# Patient Record
Sex: Male | Born: 1935
Health system: Southern US, Community
[De-identification: ages and names within clinical notes are randomized; demographics above are authoritative.]

## PROBLEM LIST (undated history)

## (undated) DIAGNOSIS — E785 Hyperlipidemia, unspecified: Secondary | ICD-10-CM

## (undated) DIAGNOSIS — K219 Gastro-esophageal reflux disease without esophagitis: Secondary | ICD-10-CM

## (undated) DIAGNOSIS — C61 Malignant neoplasm of prostate: Secondary | ICD-10-CM

## (undated) DIAGNOSIS — J45909 Unspecified asthma, uncomplicated: Secondary | ICD-10-CM

## (undated) DIAGNOSIS — R12 Heartburn: Secondary | ICD-10-CM

## (undated) DIAGNOSIS — I10 Essential (primary) hypertension: Secondary | ICD-10-CM

## (undated) DIAGNOSIS — G4730 Sleep apnea, unspecified: Secondary | ICD-10-CM

## (undated) HISTORY — DX: Hyperlipidemia, unspecified: E78.5

## (undated) HISTORY — DX: Gastro-esophageal reflux disease without esophagitis: K21.9

## (undated) HISTORY — DX: Heartburn: R12

## (undated) HISTORY — DX: Essential (primary) hypertension: I10

## (undated) HISTORY — PX: CARPAL TUNNEL RELEASE: SHX101

## (undated) HISTORY — DX: Malignant neoplasm of prostate: C61

## (undated) HISTORY — PX: PROSTATE BIOPSY: SHX241

---

## 1965-01-27 HISTORY — PX: APPENDECTOMY: SHX54

## 2000-08-12 ENCOUNTER — Ambulatory Visit (HOSPITAL_COMMUNITY): Admission: RE | Admit: 2000-08-12 | Discharge: 2000-08-12 | Payer: Self-pay | Admitting: Gastroenterology

## 2003-11-27 ENCOUNTER — Encounter: Admission: RE | Admit: 2003-11-27 | Discharge: 2003-11-27 | Payer: Self-pay | Admitting: Family Medicine

## 2013-02-08 ENCOUNTER — Other Ambulatory Visit (HOSPITAL_COMMUNITY): Payer: Self-pay | Admitting: Urology

## 2013-02-08 DIAGNOSIS — C61 Malignant neoplasm of prostate: Secondary | ICD-10-CM

## 2013-03-07 ENCOUNTER — Encounter (HOSPITAL_COMMUNITY)
Admission: RE | Admit: 2013-03-07 | Discharge: 2013-03-07 | Disposition: A | Discharge status: Home / Self Care | Payer: Medicare PPO | Source: Ambulatory Visit | Attending: Urology | Admitting: Urology

## 2013-03-07 DIAGNOSIS — C61 Malignant neoplasm of prostate: Secondary | ICD-10-CM | POA: Insufficient documentation

## 2013-03-07 DIAGNOSIS — M899 Disorder of bone, unspecified: Secondary | ICD-10-CM | POA: Insufficient documentation

## 2013-03-07 DIAGNOSIS — M949 Disorder of cartilage, unspecified: Secondary | ICD-10-CM

## 2013-03-07 MED ORDER — TECHNETIUM TC 99M MEDRONATE IV KIT
27.0000 | PACK | Freq: Once | INTRAVENOUS | Status: AC | PRN
  Administered 2013-03-07: 27 via INTRAVENOUS

## 2013-03-11 ENCOUNTER — Encounter: Payer: Self-pay | Admitting: Radiation Oncology

## 2013-03-11 DIAGNOSIS — C61 Malignant neoplasm of prostate: Secondary | ICD-10-CM | POA: Insufficient documentation

## 2013-03-11 NOTE — Progress Notes (Signed)
GU Location of Tumor / Histology: adenocarcinoma of the prostate  If Prostate Cancer, Gleason Score is (4 + 3) and PSA is (12.7) November 2014.  Patient presented 2010 with an elevated PSA of 5.1.  Biopsies of prostate (if applicable) revealed:    Past/Anticipated interventions by urology, if any: administered six month injection of Trelstar on 03/09/2013  Past/Anticipated interventions by medical oncology, if any: None  Weight changes, if any:   Bowel/Bladder complaints, if any:    Nausea/Vomiting, if any:   Pain issues, if any:    SAFETY ISSUES:  Prior radiation? NO  Pacemaker/ICD? NO  Possible current pregnancy? N/A  Is the patient on methotrexate? N/A  Current Complaints / other details:  80 year old male. Married. Self employed-upholstery. One son and one daughter.

## 2013-03-14 ENCOUNTER — Ambulatory Visit
Admission: RE | Admit: 2013-03-14 | Discharge: 2013-03-14 | Disposition: A | Discharge status: Home / Self Care | Payer: Medicare PPO | Source: Ambulatory Visit | Attending: Radiation Oncology | Admitting: Radiation Oncology

## 2013-03-14 ENCOUNTER — Encounter: Payer: Self-pay | Admitting: Radiation Oncology

## 2013-03-14 VITALS — BP 162/72 | HR 59 | Temp 98.0°F | Resp 16 | Ht 70.0 in | Wt 227.0 lb

## 2013-03-14 DIAGNOSIS — C61 Malignant neoplasm of prostate: Secondary | ICD-10-CM | POA: Insufficient documentation

## 2013-03-14 DIAGNOSIS — E785 Hyperlipidemia, unspecified: Secondary | ICD-10-CM | POA: Insufficient documentation

## 2013-03-14 DIAGNOSIS — I10 Essential (primary) hypertension: Secondary | ICD-10-CM | POA: Insufficient documentation

## 2013-03-14 DIAGNOSIS — Z7982 Long term (current) use of aspirin: Secondary | ICD-10-CM | POA: Insufficient documentation

## 2013-03-14 DIAGNOSIS — Z79899 Other long term (current) drug therapy: Secondary | ICD-10-CM | POA: Insufficient documentation

## 2013-03-14 NOTE — Addendum Note (Signed)
Encounter addended by: Heywood Footman, RN on: 03/14/2013  4:50 PM<BR>     Documentation filed: Charges VN

## 2013-03-14 NOTE — Progress Notes (Signed)
Denies dysuria or hematuria. Reports occasionally he has to strain to void. Weight stable. Denies diarrhea, painful bowel movements or blood in the stool. Denies pain at this time. Reports occasional joint pain related to effects of arthritis. Denies nausea, vomiting, headache or dizziness. Reports nocturia x 3. Reports an occasional weak stream. Reports only rare occasions that he doesn't feel like he empties his bladder completely. Reports he remain active caring for his wife in their home. Reports hospice comes once a week to bath his wife and his daughter cook their meals. Patient states, "I think I feel and look pretty good for my age."

## 2013-03-14 NOTE — Progress Notes (Signed)
Radiation Oncology         [phone] ________________________________  Initial outpatient Consultation  Name: Patrick Carey MRN: [id number]  Date: 03/14/2013  DOB: [birth year]  FW:YOVZCH,YIFOYD, PA-C  Bernestine Amass, MD   REFERRING PHYSICIAN: Bernestine Amass, MD  DIAGNOSIS: 80 y.o. gentleman with stage T1c adenocarcinoma of the prostate with a Gleason's score of 4+3 and a PSA of 12.7  HISTORY OF PRESENT ILLNESS::Patrick Carey is a 80 y.o. gentleman with a history of elevated fluctuating PSA's.  He was noted to have an elevated PSA of 10 with his primary care doctor and a repeat with Dr. Risa Grill remained elevated at 12.72 on December 09, 2012.  Dr. Risa Grill performed digital rectal examination at that time revealing no nodules.  The patient proceeded to transrectal ultrasound with 12 biopsies of the prostate on 02/10/13.  The prostate volume measured 63 cc.  Out of 10 core biopsies, 7 were positive.  The maximum Gleason score was 4+3, and this was seen in 3 of the right sided specimens and one of the left as below.  The patient reviewed the biopsy results with his urologist and he has kindly been referred today for discussion of potential radiation treatment options.  PREVIOUS RADIATION THERAPY: No  PAST MEDICAL HISTORY:  has a past medical history of Heartburn; Hyperlipidemia; Hypertension; and Prostate cancer.    PAST SURGICAL HISTORY: Past Surgical History  Procedure Laterality Date  . Prostate biopsy    . Appendectomy  1967    FAMILY HISTORY: family history includes Cancer in his brother and father.  SOCIAL HISTORY:  reports that he has never smoked. He has never used smokeless tobacco. He reports that he does not drink alcohol or use illicit drugs.  ALLERGIES: Lipitor and Prednisone  MEDICATIONS:  Current Outpatient Prescriptions  Medication Sig Dispense Refill  . aspirin 81 MG tablet Take 81 mg by mouth daily.      Marland Kitchen doxazosin (CARDURA) 4 MG tablet Take 4 mg by mouth  daily.      Marland Kitchen lisinopril-hydrochlorothiazide (PRINZIDE,ZESTORETIC) 20-12.5 MG per tablet Take 1 tablet by mouth daily.      . metoprolol (LOPRESSOR) 50 MG tablet       . omeprazole (PRILOSEC) 20 MG capsule Take 20 mg by mouth daily.      . pravastatin (PRAVACHOL) 40 MG tablet Take 40 mg by mouth daily.      . bicalutamide (CASODEX) 50 MG tablet       . metoprolol succinate (TOPROL-XL) 50 MG 24 hr tablet Take 50 mg by mouth daily. Take with or immediately following a meal.      . Omega-3 Fatty Acids (FISH OIL MAXIMUM STRENGTH PO) Take by mouth.       No current facility-administered medications for this encounter.    REVIEW OF SYSTEMS:  A 15 point review of systems is documented in the electronic medical record. This was obtained by the nursing staff. However, I reviewed this with the patient to discuss relevant findings and make appropriate changes.  A comprehensive review of systems was negative..  The patient completed an IPSS and IIEF questionnaire.  His IPSS score was 13 indicating moderate urinary outflow obstructive symptoms.  He indicated that his erectile function is unable to complete sexual activity.   PHYSICAL EXAM: This patient is in no acute distress.  He is alert and oriented.   height is 5\' 10"  (1.778 m) and weight is 227 lb (102.967 kg). His oral temperature is 98 F (  36.7 C). His blood pressure is 162/72 and his pulse is 59. His respiration is 16 and oxygen saturation is 100%.  He exhibits no respiratory distress or labored breathing.  He appears neurologically intact.  His mood is pleasant.  His affect is appropriate.  Please note the digital rectal exam findings described above.  KPS = 100  100 - Normal; no complaints; no evidence of disease. 90   - Able to carry on normal activity; minor signs or symptoms of disease. 80   - Normal activity with effort; some signs or symptoms of disease. 106   - Cares for self; unable to carry on normal activity or to do active work. 60   -  Requires occasional assistance, but is able to care for most of his personal needs. 50   - Requires considerable assistance and frequent medical care. 59   - Disabled; requires special care and assistance. 92   - Severely disabled; hospital admission is indicated although death not imminent. 63   - Very sick; hospital admission necessary; active supportive treatment necessary. 10   - Moribund; fatal processes progressing rapidly. 0     - Dead  Karnofsky DA, Abelmann WH, Craver LS and Burchenal JH [phone]) The use of the nitrogen mustards in the palliative treatment of carcinoma: with particular reference to bronchogenic carcinoma Cancer 1 634-56   LABORATORY DATA:  No results found for this basename: WBC,  HGB,  HCT,  MCV,  PLT   No results found for this basename: NA,  K,  CL,  CO2   No results found for this basename: ALT,  AST,  GGT,  ALKPHOS,  BILITOT     RADIOGRAPHY: Nm Bone Scan Whole Body  03/07/2013   CLINICAL DATA:  Prostate cancer.  EXAM: NUCLEAR MEDICINE WHOLE BODY BONE SCAN  TECHNIQUE: Whole body anterior and posterior images were obtained approximately 3 hours after intravenous injection of radiopharmaceutical.  COMPARISON:  None.  RADIOPHARMACEUTICALS:  27.0 Technetium-99 MDP  FINDINGS: Areas of moderate degenerative type uptake noted in both shoulders, both knees and both feet. There is also moderate degenerative changes at the sternoclavicular joints and in the spine. A single rib lesion is noted involving the right eighth posterior rib. This is likely posttraumatic. No findings suspicious for metastatic prostate cancer.  IMPRESSION: Areas of degenerative type uptake and a single rib lesion (likely posttraumatic).  No definite findings for prostate metastasis.   Electronically Signed   By: Kalman Jewels M.D.   On: 03/07/2013 15:27      IMPRESSION: This gentleman is a 80 y.o. gentleman with stage T1c adenocarcinoma of the prostate with a Gleason's score of 4+3 and a PSA of 12.7.   His T-Stage, Gleason's Score, and PSA put him into the intermediate risk group.  Accordingly he is eligible for a variety of potential treatment options including androgen deprivation for 6 months with definitive IMRT.  PLAN:Today I reviewed the findings and workup thus far.  We discussed the natural history of prostate cancer.  We reviewed the the implications of T-stage, Gleason's Score, and PSA on decision-making and outcomes in prostate cancer.  We discussed radiation treatment in the management of prostate cancer with regard to the logistics and delivery of external beam radiation treatment as well as the logistics and delivery of prostate brachytherapy.  We compared and contrasted each of these approaches and also compared these against prostatectomy.  The patient expressed interest in external beam radiotherapy.  I filled out a patient counseling form  for him with relevant treatment diagrams and we retained a copy for our records.   The patient would like to proceed with prostate IMRT.  But, he would like to pursue treatment closer to his home in the [facility].  I will coordinate a consultation with Dr. Orlene Erm there.  I will share my findings with Dr. Risa Grill and move forward with scheduling placement of three gold fiducial markers into the prostate to proceed with IMRT in the beginning of April.     I enjoyed meeting with him today, and will look forward to following the progress in the care of this very nice gentleman.   I spent 60 minutes face to face with the patient and more than 50% of that time was spent in counseling and/or coordination of care.   ------------------------------------------------  Sheral Apley. Tammi Klippel, M.D.

## 2013-03-14 NOTE — Progress Notes (Signed)
See progress note under physician encounter. 

## 2013-03-15 ENCOUNTER — Telehealth: Payer: Self-pay | Admitting: *Deleted

## 2013-03-15 NOTE — Telephone Encounter (Signed)
xxxx 

## 2013-03-15 NOTE — Telephone Encounter (Signed)
Called patient to inform of [facility] appt. With Dr. Risa Grill on 04-12-13- arrival time - 3:30 p.m., spoke with patient's wife - Clara and she is aware of this appt.

## 2013-03-30 NOTE — Addendum Note (Signed)
Encounter addended by: Heywood Footman, RN on: 03/30/2013  9:36 AM<BR>     Documentation filed: Charges VN

## 2014-02-16 DIAGNOSIS — M79641 Pain in right hand: Secondary | ICD-10-CM | POA: Diagnosis not present

## 2014-02-16 DIAGNOSIS — R202 Paresthesia of skin: Secondary | ICD-10-CM | POA: Diagnosis not present

## 2014-04-17 DIAGNOSIS — E782 Mixed hyperlipidemia: Secondary | ICD-10-CM | POA: Diagnosis not present

## 2014-04-17 DIAGNOSIS — Z9181 History of falling: Secondary | ICD-10-CM | POA: Diagnosis not present

## 2014-04-17 DIAGNOSIS — G4733 Obstructive sleep apnea (adult) (pediatric): Secondary | ICD-10-CM | POA: Diagnosis not present

## 2014-04-17 DIAGNOSIS — R0602 Shortness of breath: Secondary | ICD-10-CM | POA: Diagnosis not present

## 2014-04-17 DIAGNOSIS — Z79899 Other long term (current) drug therapy: Secondary | ICD-10-CM | POA: Diagnosis not present

## 2014-04-17 DIAGNOSIS — Z0000 Encounter for general adult medical examination without abnormal findings: Secondary | ICD-10-CM | POA: Diagnosis not present

## 2014-04-17 DIAGNOSIS — I10 Essential (primary) hypertension: Secondary | ICD-10-CM | POA: Diagnosis not present

## 2014-04-17 DIAGNOSIS — Z1389 Encounter for screening for other disorder: Secondary | ICD-10-CM | POA: Diagnosis not present

## 2014-04-17 DIAGNOSIS — G4700 Insomnia, unspecified: Secondary | ICD-10-CM | POA: Diagnosis not present

## 2014-04-17 DIAGNOSIS — K219 Gastro-esophageal reflux disease without esophagitis: Secondary | ICD-10-CM | POA: Diagnosis not present

## 2014-04-20 DIAGNOSIS — R05 Cough: Secondary | ICD-10-CM | POA: Diagnosis not present

## 2014-04-20 DIAGNOSIS — R918 Other nonspecific abnormal finding of lung field: Secondary | ICD-10-CM | POA: Diagnosis not present

## 2014-04-20 DIAGNOSIS — R0602 Shortness of breath: Secondary | ICD-10-CM | POA: Diagnosis not present

## 2014-04-20 DIAGNOSIS — J45909 Unspecified asthma, uncomplicated: Secondary | ICD-10-CM | POA: Diagnosis not present

## 2014-05-18 DIAGNOSIS — M25561 Pain in right knee: Secondary | ICD-10-CM | POA: Diagnosis not present

## 2014-05-18 DIAGNOSIS — R0602 Shortness of breath: Secondary | ICD-10-CM | POA: Diagnosis not present

## 2014-05-18 DIAGNOSIS — N644 Mastodynia: Secondary | ICD-10-CM | POA: Diagnosis not present

## 2014-05-24 DIAGNOSIS — M170 Bilateral primary osteoarthritis of knee: Secondary | ICD-10-CM | POA: Diagnosis not present

## 2014-05-24 DIAGNOSIS — M25561 Pain in right knee: Secondary | ICD-10-CM | POA: Diagnosis not present

## 2014-05-24 DIAGNOSIS — M1711 Unilateral primary osteoarthritis, right knee: Secondary | ICD-10-CM | POA: Diagnosis not present

## 2014-05-24 DIAGNOSIS — M25562 Pain in left knee: Secondary | ICD-10-CM | POA: Diagnosis not present

## 2014-05-24 DIAGNOSIS — M1712 Unilateral primary osteoarthritis, left knee: Secondary | ICD-10-CM | POA: Diagnosis not present

## 2014-08-03 DIAGNOSIS — N63 Unspecified lump in breast: Secondary | ICD-10-CM | POA: Diagnosis not present

## 2014-08-03 DIAGNOSIS — N644 Mastodynia: Secondary | ICD-10-CM | POA: Diagnosis not present

## 2014-08-03 DIAGNOSIS — Z6833 Body mass index (BMI) 33.0-33.9, adult: Secondary | ICD-10-CM | POA: Diagnosis not present

## 2014-08-07 DIAGNOSIS — N62 Hypertrophy of breast: Secondary | ICD-10-CM | POA: Diagnosis not present

## 2014-08-07 DIAGNOSIS — Z8546 Personal history of malignant neoplasm of prostate: Secondary | ICD-10-CM | POA: Diagnosis not present

## 2014-08-07 DIAGNOSIS — N644 Mastodynia: Secondary | ICD-10-CM | POA: Diagnosis not present

## 2014-08-10 DIAGNOSIS — C61 Malignant neoplasm of prostate: Secondary | ICD-10-CM | POA: Diagnosis not present

## 2014-09-14 DIAGNOSIS — Z6834 Body mass index (BMI) 34.0-34.9, adult: Secondary | ICD-10-CM | POA: Diagnosis not present

## 2014-09-14 DIAGNOSIS — J019 Acute sinusitis, unspecified: Secondary | ICD-10-CM | POA: Diagnosis not present

## 2014-09-25 DIAGNOSIS — G4733 Obstructive sleep apnea (adult) (pediatric): Secondary | ICD-10-CM | POA: Diagnosis not present

## 2014-09-25 DIAGNOSIS — E782 Mixed hyperlipidemia: Secondary | ICD-10-CM | POA: Diagnosis not present

## 2014-09-25 DIAGNOSIS — Z1389 Encounter for screening for other disorder: Secondary | ICD-10-CM | POA: Diagnosis not present

## 2014-09-25 DIAGNOSIS — I10 Essential (primary) hypertension: Secondary | ICD-10-CM | POA: Diagnosis not present

## 2014-09-25 DIAGNOSIS — K219 Gastro-esophageal reflux disease without esophagitis: Secondary | ICD-10-CM | POA: Diagnosis not present

## 2014-09-25 DIAGNOSIS — Z79899 Other long term (current) drug therapy: Secondary | ICD-10-CM | POA: Diagnosis not present

## 2014-09-25 DIAGNOSIS — Z9181 History of falling: Secondary | ICD-10-CM | POA: Diagnosis not present

## 2014-10-11 DIAGNOSIS — H5210 Myopia, unspecified eye: Secondary | ICD-10-CM | POA: Diagnosis not present

## 2014-10-11 DIAGNOSIS — H524 Presbyopia: Secondary | ICD-10-CM | POA: Diagnosis not present

## 2014-10-11 DIAGNOSIS — Z0100 Encounter for examination of eyes and vision without abnormal findings: Secondary | ICD-10-CM | POA: Diagnosis not present

## 2014-11-10 DIAGNOSIS — M179 Osteoarthritis of knee, unspecified: Secondary | ICD-10-CM | POA: Diagnosis not present

## 2014-11-10 DIAGNOSIS — I10 Essential (primary) hypertension: Secondary | ICD-10-CM | POA: Diagnosis not present

## 2014-11-10 DIAGNOSIS — Z6834 Body mass index (BMI) 34.0-34.9, adult: Secondary | ICD-10-CM | POA: Diagnosis not present

## 2014-11-10 DIAGNOSIS — M79674 Pain in right toe(s): Secondary | ICD-10-CM | POA: Diagnosis not present

## 2014-11-10 DIAGNOSIS — Z23 Encounter for immunization: Secondary | ICD-10-CM | POA: Diagnosis not present

## 2014-11-10 DIAGNOSIS — Z139 Encounter for screening, unspecified: Secondary | ICD-10-CM | POA: Diagnosis not present

## 2015-02-02 DIAGNOSIS — M109 Gout, unspecified: Secondary | ICD-10-CM | POA: Diagnosis not present

## 2015-02-02 DIAGNOSIS — I10 Essential (primary) hypertension: Secondary | ICD-10-CM | POA: Diagnosis not present

## 2015-02-02 DIAGNOSIS — M19041 Primary osteoarthritis, right hand: Secondary | ICD-10-CM | POA: Diagnosis not present

## 2015-02-02 DIAGNOSIS — C61 Malignant neoplasm of prostate: Secondary | ICD-10-CM | POA: Diagnosis not present

## 2015-02-02 DIAGNOSIS — E782 Mixed hyperlipidemia: Secondary | ICD-10-CM | POA: Diagnosis not present

## 2015-02-19 DIAGNOSIS — Z0000 Encounter for general adult medical examination without abnormal findings: Secondary | ICD-10-CM | POA: Diagnosis not present

## 2015-02-19 DIAGNOSIS — Z8546 Personal history of malignant neoplasm of prostate: Secondary | ICD-10-CM | POA: Diagnosis not present

## 2015-02-23 DIAGNOSIS — H6980 Other specified disorders of Eustachian tube, unspecified ear: Secondary | ICD-10-CM | POA: Diagnosis not present

## 2015-02-23 DIAGNOSIS — I10 Essential (primary) hypertension: Secondary | ICD-10-CM | POA: Diagnosis not present

## 2015-04-14 DIAGNOSIS — R062 Wheezing: Secondary | ICD-10-CM | POA: Diagnosis not present

## 2015-04-14 DIAGNOSIS — J209 Acute bronchitis, unspecified: Secondary | ICD-10-CM | POA: Diagnosis not present

## 2015-04-14 DIAGNOSIS — J069 Acute upper respiratory infection, unspecified: Secondary | ICD-10-CM | POA: Diagnosis not present

## 2015-04-17 DIAGNOSIS — J209 Acute bronchitis, unspecified: Secondary | ICD-10-CM | POA: Diagnosis not present

## 2015-04-17 DIAGNOSIS — I10 Essential (primary) hypertension: Secondary | ICD-10-CM | POA: Diagnosis not present

## 2015-04-17 DIAGNOSIS — Z6834 Body mass index (BMI) 34.0-34.9, adult: Secondary | ICD-10-CM | POA: Diagnosis not present

## 2015-04-17 DIAGNOSIS — E669 Obesity, unspecified: Secondary | ICD-10-CM | POA: Diagnosis not present

## 2015-05-18 DIAGNOSIS — J309 Allergic rhinitis, unspecified: Secondary | ICD-10-CM | POA: Diagnosis not present

## 2015-05-18 DIAGNOSIS — I10 Essential (primary) hypertension: Secondary | ICD-10-CM | POA: Diagnosis not present

## 2015-05-18 DIAGNOSIS — R51 Headache: Secondary | ICD-10-CM | POA: Diagnosis not present

## 2015-05-25 DIAGNOSIS — Z6833 Body mass index (BMI) 33.0-33.9, adult: Secondary | ICD-10-CM | POA: Diagnosis not present

## 2015-05-25 DIAGNOSIS — G500 Trigeminal neuralgia: Secondary | ICD-10-CM | POA: Diagnosis not present

## 2015-05-25 DIAGNOSIS — R51 Headache: Secondary | ICD-10-CM | POA: Diagnosis not present

## 2015-05-29 DIAGNOSIS — R51 Headache: Secondary | ICD-10-CM | POA: Diagnosis not present

## 2015-06-15 DIAGNOSIS — G500 Trigeminal neuralgia: Secondary | ICD-10-CM | POA: Diagnosis not present

## 2015-06-15 DIAGNOSIS — I10 Essential (primary) hypertension: Secondary | ICD-10-CM | POA: Diagnosis not present

## 2015-06-15 DIAGNOSIS — Z6833 Body mass index (BMI) 33.0-33.9, adult: Secondary | ICD-10-CM | POA: Diagnosis not present

## 2015-08-09 DIAGNOSIS — G500 Trigeminal neuralgia: Secondary | ICD-10-CM | POA: Diagnosis not present

## 2015-08-09 DIAGNOSIS — K219 Gastro-esophageal reflux disease without esophagitis: Secondary | ICD-10-CM | POA: Diagnosis not present

## 2015-08-09 DIAGNOSIS — M7061 Trochanteric bursitis, right hip: Secondary | ICD-10-CM | POA: Diagnosis not present

## 2015-08-09 DIAGNOSIS — Z79899 Other long term (current) drug therapy: Secondary | ICD-10-CM | POA: Diagnosis not present

## 2015-08-09 DIAGNOSIS — E782 Mixed hyperlipidemia: Secondary | ICD-10-CM | POA: Diagnosis not present

## 2015-08-09 DIAGNOSIS — G4733 Obstructive sleep apnea (adult) (pediatric): Secondary | ICD-10-CM | POA: Diagnosis not present

## 2015-08-09 DIAGNOSIS — Z6833 Body mass index (BMI) 33.0-33.9, adult: Secondary | ICD-10-CM | POA: Diagnosis not present

## 2015-08-09 DIAGNOSIS — I10 Essential (primary) hypertension: Secondary | ICD-10-CM | POA: Diagnosis not present

## 2015-08-20 DIAGNOSIS — Z8546 Personal history of malignant neoplasm of prostate: Secondary | ICD-10-CM | POA: Diagnosis not present

## 2015-08-20 DIAGNOSIS — C61 Malignant neoplasm of prostate: Secondary | ICD-10-CM | POA: Diagnosis not present

## 2015-08-20 DIAGNOSIS — R350 Frequency of micturition: Secondary | ICD-10-CM | POA: Diagnosis not present

## 2015-08-20 DIAGNOSIS — N401 Enlarged prostate with lower urinary tract symptoms: Secondary | ICD-10-CM | POA: Diagnosis not present

## 2015-12-18 DIAGNOSIS — K219 Gastro-esophageal reflux disease without esophagitis: Secondary | ICD-10-CM | POA: Diagnosis not present

## 2015-12-18 DIAGNOSIS — J069 Acute upper respiratory infection, unspecified: Secondary | ICD-10-CM | POA: Diagnosis not present

## 2016-01-04 DIAGNOSIS — M79671 Pain in right foot: Secondary | ICD-10-CM | POA: Diagnosis not present

## 2016-01-04 DIAGNOSIS — Z6834 Body mass index (BMI) 34.0-34.9, adult: Secondary | ICD-10-CM | POA: Diagnosis not present

## 2016-01-04 DIAGNOSIS — Z9181 History of falling: Secondary | ICD-10-CM | POA: Diagnosis not present

## 2016-01-04 DIAGNOSIS — Z1389 Encounter for screening for other disorder: Secondary | ICD-10-CM | POA: Diagnosis not present

## 2016-01-04 DIAGNOSIS — Z139 Encounter for screening, unspecified: Secondary | ICD-10-CM | POA: Diagnosis not present

## 2016-01-04 DIAGNOSIS — M25561 Pain in right knee: Secondary | ICD-10-CM | POA: Diagnosis not present

## 2016-01-04 DIAGNOSIS — Z23 Encounter for immunization: Secondary | ICD-10-CM | POA: Diagnosis not present

## 2016-02-11 DIAGNOSIS — G4733 Obstructive sleep apnea (adult) (pediatric): Secondary | ICD-10-CM | POA: Diagnosis not present

## 2016-02-11 DIAGNOSIS — Z23 Encounter for immunization: Secondary | ICD-10-CM | POA: Diagnosis not present

## 2016-02-11 DIAGNOSIS — G500 Trigeminal neuralgia: Secondary | ICD-10-CM | POA: Diagnosis not present

## 2016-02-11 DIAGNOSIS — M1712 Unilateral primary osteoarthritis, left knee: Secondary | ICD-10-CM | POA: Diagnosis not present

## 2016-02-11 DIAGNOSIS — Z79899 Other long term (current) drug therapy: Secondary | ICD-10-CM | POA: Diagnosis not present

## 2016-02-11 DIAGNOSIS — I10 Essential (primary) hypertension: Secondary | ICD-10-CM | POA: Diagnosis not present

## 2016-02-11 DIAGNOSIS — E782 Mixed hyperlipidemia: Secondary | ICD-10-CM | POA: Diagnosis not present

## 2016-02-11 DIAGNOSIS — G629 Polyneuropathy, unspecified: Secondary | ICD-10-CM | POA: Diagnosis not present

## 2016-02-11 DIAGNOSIS — Z0000 Encounter for general adult medical examination without abnormal findings: Secondary | ICD-10-CM | POA: Diagnosis not present

## 2016-02-18 DIAGNOSIS — M1712 Unilateral primary osteoarthritis, left knee: Secondary | ICD-10-CM | POA: Diagnosis not present

## 2016-02-26 DIAGNOSIS — M1712 Unilateral primary osteoarthritis, left knee: Secondary | ICD-10-CM | POA: Diagnosis not present

## 2016-03-12 DIAGNOSIS — Z6833 Body mass index (BMI) 33.0-33.9, adult: Secondary | ICD-10-CM | POA: Diagnosis not present

## 2016-03-12 DIAGNOSIS — J111 Influenza due to unidentified influenza virus with other respiratory manifestations: Secondary | ICD-10-CM | POA: Diagnosis not present

## 2016-03-31 DIAGNOSIS — R3915 Urgency of urination: Secondary | ICD-10-CM | POA: Diagnosis not present

## 2016-03-31 DIAGNOSIS — N401 Enlarged prostate with lower urinary tract symptoms: Secondary | ICD-10-CM | POA: Diagnosis not present

## 2016-03-31 DIAGNOSIS — Z8546 Personal history of malignant neoplasm of prostate: Secondary | ICD-10-CM | POA: Diagnosis not present

## 2016-07-28 DIAGNOSIS — R0602 Shortness of breath: Secondary | ICD-10-CM | POA: Diagnosis not present

## 2016-07-28 DIAGNOSIS — R079 Chest pain, unspecified: Secondary | ICD-10-CM | POA: Diagnosis not present

## 2016-07-28 DIAGNOSIS — R600 Localized edema: Secondary | ICD-10-CM | POA: Diagnosis not present

## 2016-07-28 DIAGNOSIS — R0609 Other forms of dyspnea: Secondary | ICD-10-CM | POA: Diagnosis not present

## 2016-07-28 DIAGNOSIS — I2510 Atherosclerotic heart disease of native coronary artery without angina pectoris: Secondary | ICD-10-CM | POA: Diagnosis not present

## 2016-08-01 DIAGNOSIS — Z6832 Body mass index (BMI) 32.0-32.9, adult: Secondary | ICD-10-CM | POA: Diagnosis not present

## 2016-08-01 DIAGNOSIS — H6691 Otitis media, unspecified, right ear: Secondary | ICD-10-CM | POA: Diagnosis not present

## 2016-08-06 DIAGNOSIS — I2510 Atherosclerotic heart disease of native coronary artery without angina pectoris: Secondary | ICD-10-CM | POA: Diagnosis not present

## 2016-08-06 DIAGNOSIS — I10 Essential (primary) hypertension: Secondary | ICD-10-CM | POA: Diagnosis not present

## 2016-08-06 DIAGNOSIS — R0609 Other forms of dyspnea: Secondary | ICD-10-CM | POA: Diagnosis not present

## 2016-08-06 DIAGNOSIS — Z6831 Body mass index (BMI) 31.0-31.9, adult: Secondary | ICD-10-CM | POA: Diagnosis not present

## 2016-08-06 DIAGNOSIS — M1712 Unilateral primary osteoarthritis, left knee: Secondary | ICD-10-CM | POA: Diagnosis not present

## 2016-10-06 DIAGNOSIS — C61 Malignant neoplasm of prostate: Secondary | ICD-10-CM | POA: Diagnosis not present

## 2016-10-06 DIAGNOSIS — N5201 Erectile dysfunction due to arterial insufficiency: Secondary | ICD-10-CM | POA: Diagnosis not present

## 2016-10-08 DIAGNOSIS — M1712 Unilateral primary osteoarthritis, left knee: Secondary | ICD-10-CM | POA: Diagnosis not present

## 2016-10-08 DIAGNOSIS — G500 Trigeminal neuralgia: Secondary | ICD-10-CM | POA: Diagnosis not present

## 2016-10-08 DIAGNOSIS — I10 Essential (primary) hypertension: Secondary | ICD-10-CM | POA: Diagnosis not present

## 2016-10-08 DIAGNOSIS — I2510 Atherosclerotic heart disease of native coronary artery without angina pectoris: Secondary | ICD-10-CM | POA: Diagnosis not present

## 2016-10-08 DIAGNOSIS — Z9181 History of falling: Secondary | ICD-10-CM | POA: Diagnosis not present

## 2016-10-08 DIAGNOSIS — E782 Mixed hyperlipidemia: Secondary | ICD-10-CM | POA: Diagnosis not present

## 2016-10-08 DIAGNOSIS — Z6831 Body mass index (BMI) 31.0-31.9, adult: Secondary | ICD-10-CM | POA: Diagnosis not present

## 2016-10-08 DIAGNOSIS — N529 Male erectile dysfunction, unspecified: Secondary | ICD-10-CM | POA: Diagnosis not present

## 2016-10-08 DIAGNOSIS — M109 Gout, unspecified: Secondary | ICD-10-CM | POA: Diagnosis not present

## 2016-11-12 DIAGNOSIS — N529 Male erectile dysfunction, unspecified: Secondary | ICD-10-CM | POA: Diagnosis not present

## 2016-11-12 DIAGNOSIS — Z23 Encounter for immunization: Secondary | ICD-10-CM | POA: Diagnosis not present

## 2016-11-12 DIAGNOSIS — Z6831 Body mass index (BMI) 31.0-31.9, adult: Secondary | ICD-10-CM | POA: Diagnosis not present

## 2016-11-12 DIAGNOSIS — I10 Essential (primary) hypertension: Secondary | ICD-10-CM | POA: Diagnosis not present

## 2016-12-11 DIAGNOSIS — N529 Male erectile dysfunction, unspecified: Secondary | ICD-10-CM | POA: Diagnosis not present

## 2016-12-11 DIAGNOSIS — M1712 Unilateral primary osteoarthritis, left knee: Secondary | ICD-10-CM | POA: Diagnosis not present

## 2016-12-11 DIAGNOSIS — I10 Essential (primary) hypertension: Secondary | ICD-10-CM | POA: Diagnosis not present

## 2017-01-09 DIAGNOSIS — G4733 Obstructive sleep apnea (adult) (pediatric): Secondary | ICD-10-CM | POA: Diagnosis not present

## 2017-01-09 DIAGNOSIS — I10 Essential (primary) hypertension: Secondary | ICD-10-CM | POA: Diagnosis not present

## 2017-01-09 DIAGNOSIS — Z6831 Body mass index (BMI) 31.0-31.9, adult: Secondary | ICD-10-CM | POA: Diagnosis not present

## 2017-01-28 DIAGNOSIS — Z6829 Body mass index (BMI) 29.0-29.9, adult: Secondary | ICD-10-CM | POA: Diagnosis not present

## 2017-01-28 DIAGNOSIS — R634 Abnormal weight loss: Secondary | ICD-10-CM | POA: Diagnosis not present

## 2017-01-28 DIAGNOSIS — G500 Trigeminal neuralgia: Secondary | ICD-10-CM | POA: Diagnosis not present

## 2017-01-28 DIAGNOSIS — I10 Essential (primary) hypertension: Secondary | ICD-10-CM | POA: Diagnosis not present

## 2017-01-28 DIAGNOSIS — R1013 Epigastric pain: Secondary | ICD-10-CM | POA: Diagnosis not present

## 2017-02-13 DIAGNOSIS — I10 Essential (primary) hypertension: Secondary | ICD-10-CM | POA: Diagnosis not present

## 2017-02-13 DIAGNOSIS — Z139 Encounter for screening, unspecified: Secondary | ICD-10-CM | POA: Diagnosis not present

## 2017-02-13 DIAGNOSIS — Z6830 Body mass index (BMI) 30.0-30.9, adult: Secondary | ICD-10-CM | POA: Diagnosis not present

## 2017-02-13 DIAGNOSIS — Z1331 Encounter for screening for depression: Secondary | ICD-10-CM | POA: Diagnosis not present

## 2017-02-13 DIAGNOSIS — N529 Male erectile dysfunction, unspecified: Secondary | ICD-10-CM | POA: Diagnosis not present

## 2017-02-19 DIAGNOSIS — Z125 Encounter for screening for malignant neoplasm of prostate: Secondary | ICD-10-CM | POA: Diagnosis not present

## 2017-02-19 DIAGNOSIS — E669 Obesity, unspecified: Secondary | ICD-10-CM | POA: Diagnosis not present

## 2017-02-19 DIAGNOSIS — Z6830 Body mass index (BMI) 30.0-30.9, adult: Secondary | ICD-10-CM | POA: Diagnosis not present

## 2017-02-19 DIAGNOSIS — E785 Hyperlipidemia, unspecified: Secondary | ICD-10-CM | POA: Diagnosis not present

## 2017-02-19 DIAGNOSIS — Z0000 Encounter for general adult medical examination without abnormal findings: Secondary | ICD-10-CM | POA: Diagnosis not present

## 2017-02-19 DIAGNOSIS — Z136 Encounter for screening for cardiovascular disorders: Secondary | ICD-10-CM | POA: Diagnosis not present

## 2017-03-19 DIAGNOSIS — M1611 Unilateral primary osteoarthritis, right hip: Secondary | ICD-10-CM | POA: Diagnosis not present

## 2017-03-19 DIAGNOSIS — N644 Mastodynia: Secondary | ICD-10-CM | POA: Diagnosis not present

## 2017-03-19 DIAGNOSIS — M25551 Pain in right hip: Secondary | ICD-10-CM | POA: Diagnosis not present

## 2017-03-19 DIAGNOSIS — Z6830 Body mass index (BMI) 30.0-30.9, adult: Secondary | ICD-10-CM | POA: Diagnosis not present

## 2017-05-19 DIAGNOSIS — I10 Essential (primary) hypertension: Secondary | ICD-10-CM | POA: Diagnosis not present

## 2017-05-19 DIAGNOSIS — M1712 Unilateral primary osteoarthritis, left knee: Secondary | ICD-10-CM | POA: Diagnosis not present

## 2017-05-19 DIAGNOSIS — E782 Mixed hyperlipidemia: Secondary | ICD-10-CM | POA: Diagnosis not present

## 2017-05-19 DIAGNOSIS — Z6830 Body mass index (BMI) 30.0-30.9, adult: Secondary | ICD-10-CM | POA: Diagnosis not present

## 2017-05-19 DIAGNOSIS — I2510 Atherosclerotic heart disease of native coronary artery without angina pectoris: Secondary | ICD-10-CM | POA: Diagnosis not present

## 2017-05-19 DIAGNOSIS — M1611 Unilateral primary osteoarthritis, right hip: Secondary | ICD-10-CM | POA: Diagnosis not present

## 2017-06-10 ENCOUNTER — Encounter (HOSPITAL_COMMUNITY): Payer: Self-pay | Admitting: Emergency Medicine

## 2017-06-10 ENCOUNTER — Other Ambulatory Visit: Payer: Self-pay

## 2017-06-10 ENCOUNTER — Emergency Department (HOSPITAL_COMMUNITY)
Admission: EM | Admit: 2017-06-10 | Discharge: 2017-06-10 | Disposition: A | Discharge status: Home / Self Care | Payer: Medicare HMO | Attending: Emergency Medicine | Admitting: Emergency Medicine

## 2017-06-10 DIAGNOSIS — Z7982 Long term (current) use of aspirin: Secondary | ICD-10-CM | POA: Diagnosis not present

## 2017-06-10 DIAGNOSIS — R110 Nausea: Secondary | ICD-10-CM | POA: Diagnosis not present

## 2017-06-10 DIAGNOSIS — Z79899 Other long term (current) drug therapy: Secondary | ICD-10-CM | POA: Diagnosis not present

## 2017-06-10 DIAGNOSIS — I10 Essential (primary) hypertension: Secondary | ICD-10-CM | POA: Diagnosis not present

## 2017-06-10 DIAGNOSIS — I4510 Unspecified right bundle-branch block: Secondary | ICD-10-CM | POA: Diagnosis not present

## 2017-06-10 LAB — URINALYSIS, ROUTINE W REFLEX MICROSCOPIC
Bilirubin Urine: NEGATIVE
Glucose, UA: 50 mg/dL — AB
Ketones, ur: NEGATIVE mg/dL
Leukocytes, UA: NEGATIVE
Nitrite: NEGATIVE
Protein, ur: NEGATIVE mg/dL
Specific Gravity, Urine: 1.018 (ref 1.005–1.030)
pH: 5 (ref 5.0–8.0)

## 2017-06-10 LAB — COMPREHENSIVE METABOLIC PANEL
ALT: 18 U/L (ref 17–63)
AST: 26 U/L (ref 15–41)
Albumin: 3.8 g/dL (ref 3.5–5.0)
Alkaline Phosphatase: 58 U/L (ref 38–126)
Anion gap: 9 (ref 5–15)
BUN: 14 mg/dL (ref 6–20)
CO2: 25 mmol/L (ref 22–32)
Calcium: 8.8 mg/dL — ABNORMAL LOW (ref 8.9–10.3)
Chloride: 99 mmol/L — ABNORMAL LOW (ref 101–111)
Creatinine, Ser: 1.02 mg/dL (ref 0.61–1.24)
GFR calc Af Amer: >60 mL/min (ref >60)
GFR calc non Af Amer: >60 mL/min (ref >60)
Glucose, Bld: 179 mg/dL — ABNORMAL HIGH (ref 65–99)
Potassium: 4.2 mmol/L (ref 3.5–5.1)
Sodium: 133 mmol/L — ABNORMAL LOW (ref 135–145)
Total Bilirubin: 0.6 mg/dL (ref 0.3–1.2)
Total Protein: 6.9 g/dL (ref 6.5–8.1)

## 2017-06-10 LAB — CBC
HCT: 38.6 % — ABNORMAL LOW (ref 39.0–52.0)
Hemoglobin: 13.2 g/dL (ref 13.0–17.0)
MCH: 30.5 pg (ref 26.0–34.0)
MCHC: 34.2 g/dL (ref 30.0–36.0)
MCV: 89.1 fL (ref 78.0–100.0)
Platelets: 220 10*3/uL (ref 150–400)
RBC: 4.33 MIL/uL (ref 4.22–5.81)
RDW: 12.6 % (ref 11.5–15.5)
WBC: 9.1 10*3/uL (ref 4.0–10.5)

## 2017-06-10 LAB — I-STAT TROPONIN, ED: Troponin i, poc: 0.01 ng/mL (ref 0.00–0.08)

## 2017-06-10 LAB — LIPASE, BLOOD: Lipase: 29 U/L (ref 11–51)

## 2017-06-10 MED ORDER — CLONIDINE HCL 0.2 MG PO TABS
0.2000 mg | ORAL_TABLET | Freq: Once | ORAL | Status: AC
  Administered 2017-06-10: 0.2 mg via ORAL
  Filled 2017-06-10: qty 1

## 2017-06-10 NOTE — Discharge Instructions (Addendum)
Follow-up with your primary care doctor.  Continue your current medications.  Return as needed for worsening symptoms.

## 2017-06-10 NOTE — ED Provider Notes (Signed)
[facility] EMERGENCY DEPARTMENT Provider Note   CSN: [id number] Arrival date & time: 06/10/17  1446     History   Chief Complaint Chief Complaint  Patient presents with  . Nausea    HPI Patrick Carey is a 84 y.o. male.  HPI Pt states this afternoon he had an episode of getting really nauseated and sweaty after finishing eating.  The sx lasted for 30 minutes. He did not feel like he was going to pass out.  He felt like he was going to throw up but he did not.  No chest pain.  No headache.  No diarrhea.  He went to his doctors office who suggested he come to the ED because they were full.  Pt states he feels fine now except a little lightheaded. Past Medical History:  Diagnosis Date  . Heartburn   . Hyperlipidemia   . Hypertension   . Prostate cancer [facility])     Patient Active Problem List   Diagnosis Date Noted  . Prostate CA ([place]) 03/11/2013    Past Surgical History:  Procedure Laterality Date  . APPENDECTOMY  1967  . PROSTATE BIOPSY          Home Medications    Prior to Admission medications   Medication Sig Start Date End Date Taking? Authorizing Provider  aspirin 81 MG tablet Take 81 mg by mouth daily.   Yes [provider]  carbamazepine (TEGRETOL) 200 MG tablet Take 200 mg by mouth 2 (two) times daily. 05/11/17  Yes [provider]  cloNIDine (CATAPRES) 0.1 MG tablet Take 0.1 mg by mouth 2 (two) times daily. 05/14/17  Yes [provider]  losartan (COZAAR) 100 MG tablet Take 100 mg by mouth daily. 05/12/17  Yes [provider]  meloxicam (MOBIC) 15 MG tablet Take 15 mg by mouth daily. 05/11/17  Yes [provider]  omeprazole (PRILOSEC) 20 MG capsule Take 20 mg by mouth daily.   Yes [provider]  pravastatin (PRAVACHOL) 80 MG tablet Take 80 mg by mouth every evening. 03/31/17  Yes [provider]  spironolactone (ALDACTONE) 100 MG tablet Take 100 mg by mouth daily. 03/30/17  Yes  [provider]  tadalafil (CIALIS) 20 MG tablet Take 20 mg by mouth daily as needed for erectile dysfunction.   Yes [provider]  bicalutamide (CASODEX) 50 MG tablet  03/08/13   [provider]    Family History Family History  Problem Relation Age of Onset  . Cancer Father        unknown  . Cancer Brother        prostate    Social History Social History   Tobacco Use  . Smoking status: Never Smoker  . Smokeless tobacco: Never Used  Substance Use Topics  . Alcohol use: No  . Drug use: No     Allergies   Lipitor [atorvastatin] and Prednisone   Review of Systems Review of Systems  All other systems reviewed and are negative.    Physical Exam Updated Vital Signs BP (!) 169/77   Pulse (!) 54   Temp 97.7 F (36.5 C) (Oral)   Resp 19   Ht 1.778 m (5\' 10" )   Wt 90.3 kg (199 lb)   SpO2 99%   BMI 28.55 kg/m   Physical Exam  Constitutional: He is oriented to person, place, and time. He appears well-developed and well-nourished. No distress.  HENT:  Head: Normocephalic and atraumatic.  Right Ear: External  ear normal.  Left Ear: External ear normal.  Mouth/Throat: Oropharynx is clear and moist.  Eyes: Conjunctivae are normal. Right eye exhibits no discharge. Left eye exhibits no discharge. No scleral icterus.  Neck: Neck supple. No tracheal deviation present.  Cardiovascular: Normal rate, regular rhythm and intact distal pulses.  Pulmonary/Chest: Effort normal and breath sounds normal. No stridor. No respiratory distress. He has no wheezes. He has no rales.  Abdominal: Soft. Bowel sounds are normal. He exhibits no distension. There is no tenderness. There is no rebound and no guarding.  Musculoskeletal: He exhibits no edema or tenderness.  Neurological: He is alert and oriented to person, place, and time. He has normal strength. No cranial nerve deficit (No facial droop, extraocular movements intact, tongue midline ) or sensory deficit.  He exhibits normal muscle tone. He displays no seizure activity. Coordination normal.  No pronator drift bilateral upper extrem, able to hold both legs off bed for 5 seconds, sensation intact in all extremities, no visual field cuts, no left or right sided neglect, normal finger-nose exam bilaterally, no nystagmus noted   Skin: Skin is warm and dry. No rash noted.  Psychiatric: He has a normal mood and affect.  Nursing note and vitals reviewed.    ED Treatments / Results  Labs (all labs ordered are listed, but only abnormal results are displayed) Labs Reviewed  COMPREHENSIVE METABOLIC PANEL - Abnormal; Notable for the following components:      Result Value   Sodium 133 (*)    Chloride 99 (*)    Glucose, Bld 179 (*)    Calcium 8.8 (*)    All other components within normal limits  CBC - Abnormal; Notable for the following components:   HCT 38.6 (*)    All other components within normal limits  URINALYSIS, ROUTINE W REFLEX MICROSCOPIC - Abnormal; Notable for the following components:   Glucose, UA 50 (*)    Hgb urine dipstick MODERATE (*)    Bacteria, UA RARE (*)    All other components within normal limits  LIPASE, BLOOD  I-STAT TROPONIN, ED    EKG EKG Interpretation  Date/Time:  Wednesday Jun 10 2017 15:30:20 EDT Ventricular Rate:  61 PR Interval:  180 QRS Duration: 158 QT Interval:  454 QTC Calculation: 457 R Axis:   -75 Text Interpretation:  Normal sinus rhythm Right bundle branch block Left anterior fascicular block  Bifascicular block  Abnormal ECG No old tracing to compare Confirmed by Dorie Rank [phone]) on 06/10/2017 8:33:11 PM   Radiology No results found.  Procedures Procedures (including critical care time)  Medications Ordered in ED Medications  cloNIDine (CATAPRES) tablet 0.2 mg (0.2 mg Oral Given 06/10/17 2036)     Initial Impression / Assessment and Plan / ED Course  I have reviewed the triage vital signs and the nursing notes.  Pertinent labs &  imaging results that were available during my care of the patient were reviewed by me and considered in my medical decision making (see chart for details).  Clinical Course as of Jun 11 2139  Wed Jun 10, 2017  2141 BP has improved with his evening dose of clonidine   [JK]    Clinical Course User Index [JK] Dorie Rank, MD    Patient presented to the emergency room for evaluation of an episode of nausea earlier today.  This occurred after eating.  Sounds as if the patient may have had a mild vasovagal episode associated with nausea.  Patient symptoms have all resolved.  ED work-up is reassuring.  Normal labs and x-rays.  No signs to suggest acute infection.  No signs to suggest acute coronary syndrome.  Final Clinical Impressions(s) / ED Diagnoses   Final diagnoses:  Nausea  Essential hypertension      Dorie Rank, MD 06/10/17 2143

## 2017-06-10 NOTE — ED Triage Notes (Signed)
Onset one day ago patient eating food and bite left side of tongue. Today while eating bit left side of tongue again and while eating developed nausea and cold sweats lasting 30 minutes. Denies shortness of breath or chest pain. States felt good today and [place] 4 [place] the mall.  Went to primary doctor office and sent to the ED for evaluation. Alert answering and following commands appropriate.

## 2017-08-12 DIAGNOSIS — Z6830 Body mass index (BMI) 30.0-30.9, adult: Secondary | ICD-10-CM | POA: Diagnosis not present

## 2017-08-12 DIAGNOSIS — J029 Acute pharyngitis, unspecified: Secondary | ICD-10-CM | POA: Diagnosis not present

## 2017-08-20 DIAGNOSIS — K219 Gastro-esophageal reflux disease without esophagitis: Secondary | ICD-10-CM | POA: Diagnosis not present

## 2017-08-20 DIAGNOSIS — G500 Trigeminal neuralgia: Secondary | ICD-10-CM | POA: Diagnosis not present

## 2017-08-20 DIAGNOSIS — Z1339 Encounter for screening examination for other mental health and behavioral disorders: Secondary | ICD-10-CM | POA: Diagnosis not present

## 2017-08-20 DIAGNOSIS — I2510 Atherosclerotic heart disease of native coronary artery without angina pectoris: Secondary | ICD-10-CM | POA: Diagnosis not present

## 2017-08-20 DIAGNOSIS — Z6829 Body mass index (BMI) 29.0-29.9, adult: Secondary | ICD-10-CM | POA: Diagnosis not present

## 2017-08-20 DIAGNOSIS — M1712 Unilateral primary osteoarthritis, left knee: Secondary | ICD-10-CM | POA: Diagnosis not present

## 2017-08-20 DIAGNOSIS — E782 Mixed hyperlipidemia: Secondary | ICD-10-CM | POA: Diagnosis not present

## 2017-08-20 DIAGNOSIS — G4733 Obstructive sleep apnea (adult) (pediatric): Secondary | ICD-10-CM | POA: Diagnosis not present

## 2017-08-20 DIAGNOSIS — I10 Essential (primary) hypertension: Secondary | ICD-10-CM | POA: Diagnosis not present

## 2017-09-01 DIAGNOSIS — M545 Low back pain: Secondary | ICD-10-CM | POA: Diagnosis not present

## 2017-09-01 DIAGNOSIS — Z6829 Body mass index (BMI) 29.0-29.9, adult: Secondary | ICD-10-CM | POA: Diagnosis not present

## 2017-11-11 DIAGNOSIS — M7551 Bursitis of right shoulder: Secondary | ICD-10-CM | POA: Diagnosis not present

## 2017-11-11 DIAGNOSIS — Z6829 Body mass index (BMI) 29.0-29.9, adult: Secondary | ICD-10-CM | POA: Diagnosis not present

## 2017-11-11 DIAGNOSIS — K5900 Constipation, unspecified: Secondary | ICD-10-CM | POA: Diagnosis not present

## 2017-11-11 DIAGNOSIS — Z23 Encounter for immunization: Secondary | ICD-10-CM | POA: Diagnosis not present

## 2017-12-02 DIAGNOSIS — I2510 Atherosclerotic heart disease of native coronary artery without angina pectoris: Secondary | ICD-10-CM | POA: Diagnosis not present

## 2017-12-02 DIAGNOSIS — G4733 Obstructive sleep apnea (adult) (pediatric): Secondary | ICD-10-CM | POA: Diagnosis not present

## 2017-12-02 DIAGNOSIS — G500 Trigeminal neuralgia: Secondary | ICD-10-CM | POA: Diagnosis not present

## 2017-12-02 DIAGNOSIS — E782 Mixed hyperlipidemia: Secondary | ICD-10-CM | POA: Diagnosis not present

## 2017-12-02 DIAGNOSIS — Z9181 History of falling: Secondary | ICD-10-CM | POA: Diagnosis not present

## 2017-12-02 DIAGNOSIS — K219 Gastro-esophageal reflux disease without esophagitis: Secondary | ICD-10-CM | POA: Diagnosis not present

## 2017-12-02 DIAGNOSIS — Z6829 Body mass index (BMI) 29.0-29.9, adult: Secondary | ICD-10-CM | POA: Diagnosis not present

## 2017-12-02 DIAGNOSIS — I10 Essential (primary) hypertension: Secondary | ICD-10-CM | POA: Diagnosis not present

## 2017-12-02 DIAGNOSIS — M1712 Unilateral primary osteoarthritis, left knee: Secondary | ICD-10-CM | POA: Diagnosis not present

## 2017-12-16 DIAGNOSIS — E875 Hyperkalemia: Secondary | ICD-10-CM | POA: Diagnosis not present

## 2018-01-28 DIAGNOSIS — Z6830 Body mass index (BMI) 30.0-30.9, adult: Secondary | ICD-10-CM | POA: Diagnosis not present

## 2018-01-28 DIAGNOSIS — R110 Nausea: Secondary | ICD-10-CM | POA: Diagnosis not present

## 2018-02-22 DIAGNOSIS — Z9181 History of falling: Secondary | ICD-10-CM | POA: Diagnosis not present

## 2018-02-22 DIAGNOSIS — E785 Hyperlipidemia, unspecified: Secondary | ICD-10-CM | POA: Diagnosis not present

## 2018-02-22 DIAGNOSIS — Z1331 Encounter for screening for depression: Secondary | ICD-10-CM | POA: Diagnosis not present

## 2018-02-22 DIAGNOSIS — Z0000 Encounter for general adult medical examination without abnormal findings: Secondary | ICD-10-CM | POA: Diagnosis not present

## 2018-02-22 DIAGNOSIS — Z139 Encounter for screening, unspecified: Secondary | ICD-10-CM | POA: Diagnosis not present

## 2018-02-22 DIAGNOSIS — Z6831 Body mass index (BMI) 31.0-31.9, adult: Secondary | ICD-10-CM | POA: Diagnosis not present

## 2018-08-10 DIAGNOSIS — I10 Essential (primary) hypertension: Secondary | ICD-10-CM | POA: Diagnosis not present

## 2018-08-10 DIAGNOSIS — M109 Gout, unspecified: Secondary | ICD-10-CM | POA: Diagnosis not present

## 2018-08-10 DIAGNOSIS — M1712 Unilateral primary osteoarthritis, left knee: Secondary | ICD-10-CM | POA: Diagnosis not present

## 2018-08-10 DIAGNOSIS — Z6831 Body mass index (BMI) 31.0-31.9, adult: Secondary | ICD-10-CM | POA: Diagnosis not present

## 2018-08-10 DIAGNOSIS — E782 Mixed hyperlipidemia: Secondary | ICD-10-CM | POA: Diagnosis not present

## 2018-08-10 DIAGNOSIS — R5383 Other fatigue: Secondary | ICD-10-CM | POA: Diagnosis not present

## 2018-08-31 DIAGNOSIS — E782 Mixed hyperlipidemia: Secondary | ICD-10-CM | POA: Diagnosis not present

## 2018-08-31 DIAGNOSIS — M1712 Unilateral primary osteoarthritis, left knee: Secondary | ICD-10-CM | POA: Diagnosis not present

## 2018-08-31 DIAGNOSIS — G4733 Obstructive sleep apnea (adult) (pediatric): Secondary | ICD-10-CM | POA: Diagnosis not present

## 2018-08-31 DIAGNOSIS — Z01818 Encounter for other preprocedural examination: Secondary | ICD-10-CM | POA: Diagnosis not present

## 2018-08-31 DIAGNOSIS — Z6830 Body mass index (BMI) 30.0-30.9, adult: Secondary | ICD-10-CM | POA: Diagnosis not present

## 2018-08-31 DIAGNOSIS — I2510 Atherosclerotic heart disease of native coronary artery without angina pectoris: Secondary | ICD-10-CM | POA: Diagnosis not present

## 2018-08-31 DIAGNOSIS — I10 Essential (primary) hypertension: Secondary | ICD-10-CM | POA: Diagnosis not present

## 2018-09-01 ENCOUNTER — Other Ambulatory Visit (HOSPITAL_COMMUNITY): Payer: Self-pay | Admitting: *Deleted

## 2018-09-01 NOTE — Patient Instructions (Addendum)
YOU NEED TO HAVE A COVID 19 TEST ON 09-03-2018 @ , THIS TEST MUST BE DONE BEFORE SURGERY, COME  [facility], [place] , [id number]. ONCE YOUR COVID TEST IS COMPLETED, PLEASE BEGIN THE QUARANTINE INSTRUCTIONS AS OUTLINED IN YOUR HANDOUT.                Patrick Carey   Your procedure is scheduled on: 09-07-2018  Report to [facility] Main  Entrance               Report to admitting at 1015 AM   [address] [place].    Call this number if you have problems the morning of surgery [phone]    Remember: [place], NO CHEWING GUM CANDY OR MINTS.   NO SOLID FOOD AFTER MIDNIGHT THE NIGHT PRIOR TO SURGERY. NOTHING BY MOUTH EXCEPT CLEAR LIQUIDS UNTIL 945 AM. PLEASE FINISH ENSURE DRINK PER SURGEON ORDER 3 HOURS PRIOR TO SCHEDULED SURGERY TIME WHICH NEEDS TO BE COMPLETED AT            945 AM.   CLEAR LIQUID DIET   Foods Allowed                                                                     Foods Excluded  Coffee and tea, regular and decaf                             liquids that you cannot  Plain Jell-O any favor except red or purple                                           see through such as: Fruit ices (not with fruit pulp)                                     milk, soups, orange juice  Iced Popsicles                                    All solid food Carbonated beverages, regular and diet                                    Cranberry, grape and apple juices Sports drinks like Gatorade Lightly seasoned clear broth or consume(fat free) Sugar, honey syrup  Sample Menu Breakfast                                Lunch                                     Supper Cranberry  juice                    Beef broth                            Chicken broth Jell-O                                     Grape juice                           Apple juice Coffee or tea                         Jell-O                                      Popsicle                                                Coffee or tea                        Coffee or tea  _____________________________________________________________________     Take these medicines the morning of surgery with A SIP OF WATER:                                 You may not have any metal on your body including hair pins and              piercings  Do not wear jewelry,  lotions, powders or perfumes, deodorant                    Men may shave face and neck.   Do not bring valuables to the hospital. [place].  Contacts, dentures or bridgework may not be worn into surgery. _____________________________________________________________________           [facility] - Preparing for Surgery Before surgery, you can play an important role.  Because skin is not sterile, your skin needs to be as free of germs as possible.  You can reduce the number of germs on your skin by washing with CHG (chlorahexidine gluconate) soap before surgery.  CHG is an antiseptic cleaner which kills germs and bonds with the skin to continue killing germs even after washing. Please DO NOT use if you have an allergy to CHG or antibacterial soaps.  If your skin becomes reddened/irritated stop using the CHG and inform your nurse when you arrive at Short Stay. Do not shave (including legs and underarms) for at least 48 hours prior to the first CHG shower.  You may shave your face/neck. Please follow these instructions carefully:  1.  Shower with CHG Soap the night before surgery and the  morning of Surgery.  2.  If you choose to wash your hair, wash your hair first as usual with your  normal  shampoo.  3.  After you shampoo, rinse your hair and body thoroughly to remove the  shampoo.                           4.  Use CHG as you would any other liquid soap.  You can apply chg directly  to the skin and wash                        Gently with a scrungie or clean washcloth.  5.  Apply the CHG Soap to your body ONLY FROM THE NECK DOWN.   Do not use on face/ open                           Wound or open sores. Avoid contact with eyes, ears mouth and genitals (private parts).                       Wash face,  Genitals (private parts) with your normal soap.             6.  Wash thoroughly, paying special attention to the area where your surgery  will be performed.  7.  Thoroughly rinse your body with warm water from the neck down.  8.  DO NOT shower/wash with your normal soap after using and rinsing off  the CHG Soap.                9.  Pat yourself dry with a clean towel.            10.  Wear clean pajamas.            11.  Place clean sheets on your bed the night of your first shower and do not  sleep with pets. Day of Surgery : Do not apply any lotions/deodorants the morning of surgery.  Please wear clean clothes to the hospital/surgery center.  FAILURE TO FOLLOW THESE INSTRUCTIONS MAY RESULT IN THE CANCELLATION OF YOUR SURGERY PATIENT SIGNATURE_________________________________  NURSE SIGNATURE__________________________________  ________________________________________________________________________   Patrick Carey  An incentive spirometer is a tool that can help keep your lungs clear and active. This tool measures how well you are filling your lungs with each breath. Taking long deep breaths may help reverse or decrease the chance of developing breathing (pulmonary) problems (especially infection) following:  A long period of time when you are unable to move or be active. BEFORE THE PROCEDURE   If the spirometer includes an indicator to show your best effort, your nurse or respiratory therapist will set it to a desired goal.  If possible, sit up straight or lean slightly forward. Try not to slouch.  Hold the incentive spirometer in an upright position. INSTRUCTIONS FOR USE  1. Sit on the  edge of your bed if possible, or sit up as far as you can in bed or on a chair. 2. Hold the incentive spirometer in an upright position. 3. Breathe out normally. 4. Place the mouthpiece in your mouth and seal your lips tightly around it. 5. Breathe in slowly and as deeply as possible, raising the piston or the ball toward the top of the column. 6. Hold your breath for 3-5 seconds or for as long as possible. Allow the piston or ball to fall to the bottom of the column. 7. Remove the mouthpiece from your mouth and breathe out normally. 8.  Rest for a few seconds and repeat Steps 1 through 7 at least 10 times every 1-2 hours when you are awake. Take your time and take a few normal breaths between deep breaths. 9. The spirometer may include an indicator to show your best effort. Use the indicator as a goal to work toward during each repetition. 10. After each set of 10 deep breaths, practice coughing to be sure your lungs are clear. If you have an incision (the cut made at the time of surgery), support your incision when coughing by placing a pillow or rolled up towels firmly against it. Once you are able to get out of bed, walk around indoors and cough well. You may stop using the incentive spirometer when instructed by your caregiver.  RISKS AND COMPLICATIONS  Take your time so you do not get dizzy or light-headed.  If you are in pain, you may need to take or ask for pain medication before doing incentive spirometry. It is harder to take a deep breath if you are having pain. AFTER USE  Rest and breathe slowly and easily.  It can be helpful to keep track of a log of your progress. Your caregiver can provide you with a simple table to help with this. If you are using the spirometer at home, follow these instructions: [place] IF:   You are having difficultly using the spirometer.  You have trouble using the spirometer as often as instructed.  Your pain medication is not giving enough  relief while using the spirometer.  You develop fever of 100.5 F (38.1 C) or higher. SEEK IMMEDIATE MEDICAL CARE IF:   You cough up bloody sputum that had not been present before.  You develop fever of 102 F (38.9 C) or greater.  You develop worsening pain at or near the incision site. MAKE SURE YOU:   Understand these instructions.  Will watch your condition.  Will get help right away if you are not doing well or get worse. Document Released: 05/26/2006 Document Revised: 04/07/2011 Document Reviewed: 07/27/2006 ExitCare Patient Information 2014 Exit[employer], [place].   ________________________________________________________________________  WHAT IS A BLOOD TRANSFUSION? Blood Transfusion Information  A transfusion is the replacement of blood or some of its parts. Blood is made up of multiple cells which provide different functions.  Red blood cells carry oxygen and are used for blood loss replacement.  White blood cells fight against infection.  Platelets control bleeding.  Plasma helps clot blood.  Other blood products are available for specialized needs, such as hemophilia or other clotting disorders. BEFORE THE TRANSFUSION  Who gives blood for transfusions?   Healthy volunteers who are fully evaluated to make sure their blood is safe. This is blood bank blood. Transfusion therapy is the safest it has ever been in the practice of medicine. Before blood is taken from a donor, a complete history is taken to make sure that person has no history of diseases nor engages in risky social behavior (examples are intravenous drug use or sexual activity with multiple partners). The donor's travel history is screened to minimize risk of transmitting infections, such as malaria. The donated blood is tested for signs of infectious diseases, such as HIV and hepatitis. The blood is then tested to be sure it is compatible with you in order to minimize the chance of a transfusion reaction. If  you or a relative donates blood, this is often done in anticipation of surgery and is not appropriate for emergency situations. It takes  many days to process the donated blood. RISKS AND COMPLICATIONS Although transfusion therapy is very safe and saves many lives, the main dangers of transfusion include:   Getting an infectious disease.  Developing a transfusion reaction. This is an allergic reaction to something in the blood you were given. Every precaution is taken to prevent this. The decision to have a blood transfusion has been considered carefully by your caregiver before blood is given. Blood is not given unless the benefits outweigh the risks. AFTER THE TRANSFUSION  Right after receiving a blood transfusion, you will usually feel much better and more energetic. This is especially true if your red blood cells have gotten low (anemic). The transfusion raises the level of the red blood cells which carry oxygen, and this usually causes an energy increase.  The nurse administering the transfusion will monitor you carefully for complications. HOME CARE INSTRUCTIONS  No special instructions are needed after a transfusion. You may find your energy is better. Speak with your caregiver about any limitations on activity for underlying diseases you may have. SEEK MEDICAL CARE IF:   Your condition is not improving after your transfusion.  You develop redness or irritation at the intravenous (IV) site. SEEK IMMEDIATE MEDICAL CARE IF:  Any of the following symptoms occur over the next 12 hours:  Shaking chills.  You have a temperature by mouth above 102 F (38.9 C), not controlled by medicine.  Chest, back, or muscle pain.  People around you feel you are not acting correctly or are confused.  Shortness of breath or difficulty breathing.  Dizziness and fainting.  You get a rash or develop hives.  You have a decrease in urine output.  Your urine turns a dark color or changes to pink,  red, or brown. Any of the following symptoms occur over the next 10 days:  You have a temperature by mouth above 102 F (38.9 C), not controlled by medicine.  Shortness of breath.  Weakness after normal activity.  The white part of the eye turns yellow (jaundice).  You have a decrease in the amount of urine or are urinating less often.  Your urine turns a dark color or changes to pink, red, or brown. Document Released: 01/11/2000 Document Revised: 04/07/2011 Document Reviewed: 08/30/2007 [facility] Patient Information 2014 [place], [place].  _______________________________________________________________________

## 2018-09-02 ENCOUNTER — Other Ambulatory Visit: Payer: Self-pay

## 2018-09-02 ENCOUNTER — Encounter (HOSPITAL_COMMUNITY)
Admission: RE | Admit: 2018-09-02 | Discharge: 2018-09-02 | Disposition: A | Discharge status: Home / Self Care | Payer: No Typology Code available for payment source | Source: Ambulatory Visit | Attending: Orthopedic Surgery | Admitting: Orthopedic Surgery

## 2018-09-02 ENCOUNTER — Encounter (HOSPITAL_COMMUNITY): Payer: Self-pay

## 2018-09-02 DIAGNOSIS — Z20828 Contact with and (suspected) exposure to other viral communicable diseases: Secondary | ICD-10-CM | POA: Diagnosis not present

## 2018-09-02 DIAGNOSIS — Z01818 Encounter for other preprocedural examination: Secondary | ICD-10-CM | POA: Insufficient documentation

## 2018-09-02 DIAGNOSIS — M1712 Unilateral primary osteoarthritis, left knee: Secondary | ICD-10-CM | POA: Insufficient documentation

## 2018-09-02 HISTORY — DX: Unspecified asthma, uncomplicated: J45.909

## 2018-09-02 HISTORY — DX: Sleep apnea, unspecified: G47.30

## 2018-09-02 LAB — CBC
HCT: 39.1 % (ref 39.0–52.0)
Hemoglobin: 12.8 g/dL — ABNORMAL LOW (ref 13.0–17.0)
MCH: 31 pg (ref 26.0–34.0)
MCHC: 32.7 g/dL (ref 30.0–36.0)
MCV: 94.7 fL (ref 80.0–100.0)
Platelets: 181 10*3/uL (ref 150–400)
RBC: 4.13 MIL/uL — ABNORMAL LOW (ref 4.22–5.81)
RDW: 13.1 % (ref 11.5–15.5)
WBC: 6.6 10*3/uL (ref 4.0–10.5)
nRBC: 0 % (ref 0.0–0.2)

## 2018-09-02 LAB — BASIC METABOLIC PANEL
Anion gap: 7 (ref 5–15)
BUN: 26 mg/dL — ABNORMAL HIGH (ref 8–23)
CO2: 27 mmol/L (ref 22–32)
Calcium: 9.2 mg/dL (ref 8.9–10.3)
Chloride: 105 mmol/L (ref 98–111)
Creatinine, Ser: 1.2 mg/dL (ref 0.61–1.24)
GFR calc Af Amer: >60 mL/min (ref >60)
GFR calc non Af Amer: 56 mL/min — ABNORMAL LOW (ref >60)
Glucose, Bld: 109 mg/dL — ABNORMAL HIGH (ref 70–99)
Potassium: 4.8 mmol/L (ref 3.5–5.1)
Sodium: 139 mmol/L (ref 135–145)

## 2018-09-02 LAB — ABO/RH: ABO/RH(D): O POS

## 2018-09-02 LAB — SURGICAL PCR SCREEN
MRSA, PCR: NEGATIVE
Staphylococcus aureus: NEGATIVE

## 2018-09-02 NOTE — Progress Notes (Signed)
ekg 08-31-18 on chart

## 2018-09-03 ENCOUNTER — Other Ambulatory Visit (HOSPITAL_COMMUNITY)
Admission: RE | Admit: 2018-09-03 | Discharge: 2018-09-03 | Disposition: A | Discharge status: Home / Self Care | Payer: No Typology Code available for payment source | Source: Ambulatory Visit | Attending: Orthopedic Surgery | Admitting: Orthopedic Surgery

## 2018-09-03 DIAGNOSIS — Z01818 Encounter for other preprocedural examination: Secondary | ICD-10-CM | POA: Diagnosis not present

## 2018-09-03 NOTE — H&P (Signed)
TOTAL KNEE ADMISSION H&P  Patient is being admitted for left total knee arthroplasty.  Subjective:  Chief Complaint:     Left knee primary OA / pain  HPI: Patrick Carey, 85 y.o. male, has a history of pain and functional disability in the left knee due to arthritis and has failed non-surgical conservative treatments for greater than 12 weeks to includeNSAID's and/or analgesics, corticosteriod injections, viscosupplementation injections, use of assistive devices and activity modification.  Onset of symptoms was gradual, starting >10 years ago with gradually worsening course since that time. The patient noted no past surgery on the left knee(s).  Patient currently rates pain in the left knee(s) at 9 out of 10 with activity. Patient has worsening of pain with activity and weight bearing, pain that interferes with activities of daily living, pain with passive range of motion, crepitus and joint swelling.  Patient has evidence of periarticular osteophytes and joint space narrowing by imaging studies.  There is no active infection.   Risks, benefits and expectations were discussed with the patient.  Risks including but not limited to the risk of anesthesia, blood clots, nerve damage, blood vessel damage, failure of the prosthesis, infection and up to and including death.  Patient understand the risks, benefits and expectations and wishes to proceed with surgery.   PCP: Cyndi Bender, PA-C  D/C Plans:       Home   Post-op Meds:       No Rx given   Tranexamic Acid:      To be given - IV   Decadron:      Is to be given  FYI:      ASA  Norco  CPAP  DME:   Pt already has equipment   PT:   OPPT   Pharmacy: [facility]     Patient Active Problem List   Diagnosis Date Noted  . Prostate CA ([place]) 03/11/2013   Past Medical History:  Diagnosis Date  . Asthma    allergy related  . Heartburn   . Hyperlipidemia   . Hypertension   . Prostate cancer ([place])   . Sleep apnea    cpap     Past Surgical History:  Procedure Laterality Date  . APPENDECTOMY  1967  . PROSTATE BIOPSY      No current facility-administered medications for this encounter.    Current Outpatient Medications  Medication Sig Dispense Refill Last Dose  . amLODipine (NORVASC) 2.5 MG tablet Take 2.5 mg by mouth daily.     Marland Kitchen aspirin 81 MG tablet Take 81 mg by mouth daily.     . carbamazepine (TEGRETOL) 200 MG tablet Take 200 mg by mouth 2 (two) times daily.     . cloNIDine (CATAPRES) 0.2 MG tablet Take 0.1 mg by mouth 2 (two) times daily.      Marland Kitchen losartan (COZAAR) 100 MG tablet Take 100 mg by mouth daily.     . meloxicam (MOBIC) 15 MG tablet Take 15 mg by mouth daily.     Marland Kitchen omeprazole (PRILOSEC) 20 MG capsule Take 20 mg by mouth daily.     . pravastatin (PRAVACHOL) 80 MG tablet Take 80 mg by mouth every evening.     Marland Kitchen spironolactone (ALDACTONE) 100 MG tablet Take 100 mg by mouth daily.      Allergies  Allergen Reactions  . Lipitor [Atorvastatin] Other (See Comments)    Can tolerate in low dose  . Prednisone Other (See Comments)    sleepless  Social History   Tobacco Use  . Smoking status: Never Smoker  . Smokeless tobacco: Never Used  Substance Use Topics  . Alcohol use: No    Family History  Problem Relation Age of Onset  . Cancer Father        unknown  . Cancer Brother        prostate     Review of Systems  Constitutional: Negative.   HENT: Negative.   Eyes: Negative.   Respiratory: Negative.   Cardiovascular: Negative.   Gastrointestinal: Positive for heartburn.  Genitourinary: Negative.   Musculoskeletal: Positive for joint pain.  Skin: Negative.   Neurological: Negative.   Endo/Heme/Allergies: Negative.   Psychiatric/Behavioral: Negative.     Objective:  Physical Exam  Constitutional: He is oriented to person, place, and time. He appears well-developed.  HENT:  Head: Normocephalic.  Eyes: Pupils are equal, round, and reactive to light.  Neck: Neck supple. No JVD  present. No tracheal deviation present. No thyromegaly present.  Cardiovascular: Normal rate, regular rhythm and intact distal pulses.  Respiratory: Effort normal and breath sounds normal. No respiratory distress. He has no wheezes.  GI: Soft. There is no abdominal tenderness. There is no guarding.  Musculoskeletal:     Left knee: He exhibits swelling and bony tenderness. He exhibits no ecchymosis, no deformity, no laceration and no erythema. Tenderness found.  Lymphadenopathy:    He has no cervical adenopathy.  Neurological: He is alert and oriented to person, place, and time. A sensory deficit (occasional numbness in theLEs) is present.  Skin: Skin is warm and dry.  Psychiatric: He has a normal mood and affect.    Vital signs in last 24 hours: Temp:  [98.5 F (36.9 C)] 98.5 F (36.9 C) (08/06 0919) Pulse Rate:  [63] 63 (08/06 0919) Resp:  [16] 16 (08/06 0919) BP: (135)/(72) 135/72 (08/06 0919) SpO2:  [99 %] 99 % (08/06 0919) Weight:  [95.3 kg] 95.3 kg (08/06 0919)  Labs:   Estimated body mass index is 30.13 kg/m as calculated from the following:   Height as of 09/02/18: 5\' 10"  (1.778 m).   Weight as of 09/02/18: 95.3 kg.   Imaging Review Plain radiographs demonstrate severe degenerative joint disease of the left knee(s). The overall alignment isneutral. The bone quality appears to be good for age and reported activity level.      Assessment/Plan:  End stage arthritis, left knee   The patient history, physical examination, clinical judgment of the provider and imaging studies are consistent with end stage degenerative joint disease of the left knee(s) and total knee arthroplasty is deemed medically necessary. The treatment options including medical management, injection therapy arthroscopy and arthroplasty were discussed at length. The risks and benefits of total knee arthroplasty were presented and reviewed. The risks due to aseptic loosening, infection, stiffness, patella  tracking problems, thromboembolic complications and other imponderables were discussed. The patient acknowledged the explanation, agreed to proceed with the plan and consent was signed. Patient is being admitted for inpatient treatment for surgery, pain control, PT, OT, prophylactic antibiotics, VTE prophylaxis, progressive ambulation and ADL's and discharge planning. The patient is planning to be discharged home.    Patient's anticipated LOS is less than 2 midnights, meeting these requirements: - Lives within 1 hour of care - Has a competent adult at home to recover with post-op recover - NO history of  - Chronic pain requiring opiods  - Diabetes  - Coronary Artery Disease  - Heart failure  - Heart  attack  - Stroke  - DVT/VTE  - Cardiac arrhythmia  - Respiratory Failure/COPD  - Renal failure  - Anemia  - Advanced Liver disease      [place] Pugh. Malak Orantes   PA-C  09/03/2018, 12:06 AM

## 2018-09-04 LAB — SARS CORONAVIRUS 2 (TAT 6-24 HRS): SARS Coronavirus 2: NEGATIVE

## 2018-09-07 ENCOUNTER — Telehealth (HOSPITAL_COMMUNITY): Payer: Self-pay | Admitting: *Deleted

## 2018-09-07 ENCOUNTER — Ambulatory Visit (HOSPITAL_COMMUNITY): Payer: No Typology Code available for payment source | Admitting: Physician Assistant

## 2018-09-07 ENCOUNTER — Observation Stay (HOSPITAL_COMMUNITY)
Admission: RE | Admit: 2018-09-07 | Discharge: 2018-09-08 | Disposition: A | Discharge status: Home / Self Care | Payer: No Typology Code available for payment source | Attending: Orthopedic Surgery | Admitting: Orthopedic Surgery

## 2018-09-07 ENCOUNTER — Encounter (HOSPITAL_COMMUNITY): Payer: Self-pay

## 2018-09-07 ENCOUNTER — Ambulatory Visit (HOSPITAL_COMMUNITY): Payer: No Typology Code available for payment source | Admitting: Certified Registered Nurse Anesthetist

## 2018-09-07 ENCOUNTER — Other Ambulatory Visit: Payer: Self-pay

## 2018-09-07 ENCOUNTER — Encounter (HOSPITAL_COMMUNITY)
Admission: RE | Disposition: A | Discharge status: Home / Self Care | Payer: Self-pay | Source: Home / Self Care | Attending: Orthopedic Surgery

## 2018-09-07 DIAGNOSIS — R12 Heartburn: Secondary | ICD-10-CM | POA: Insufficient documentation

## 2018-09-07 DIAGNOSIS — I10 Essential (primary) hypertension: Secondary | ICD-10-CM | POA: Diagnosis not present

## 2018-09-07 DIAGNOSIS — M1712 Unilateral primary osteoarthritis, left knee: Secondary | ICD-10-CM | POA: Diagnosis present

## 2018-09-07 DIAGNOSIS — E785 Hyperlipidemia, unspecified: Secondary | ICD-10-CM | POA: Diagnosis not present

## 2018-09-07 DIAGNOSIS — Z79899 Other long term (current) drug therapy: Secondary | ICD-10-CM | POA: Insufficient documentation

## 2018-09-07 DIAGNOSIS — G4730 Sleep apnea, unspecified: Secondary | ICD-10-CM | POA: Diagnosis not present

## 2018-09-07 DIAGNOSIS — Z8546 Personal history of malignant neoplasm of prostate: Secondary | ICD-10-CM | POA: Diagnosis not present

## 2018-09-07 DIAGNOSIS — Z791 Long term (current) use of non-steroidal anti-inflammatories (NSAID): Secondary | ICD-10-CM | POA: Diagnosis not present

## 2018-09-07 DIAGNOSIS — Z7982 Long term (current) use of aspirin: Secondary | ICD-10-CM | POA: Diagnosis not present

## 2018-09-07 DIAGNOSIS — Z96652 Presence of left artificial knee joint: Secondary | ICD-10-CM

## 2018-09-07 HISTORY — PX: TOTAL KNEE ARTHROPLASTY: SHX125

## 2018-09-07 LAB — TYPE AND SCREEN
ABO/RH(D): O POS
Antibody Screen: NEGATIVE

## 2018-09-07 SURGERY — ARTHROPLASTY, KNEE, TOTAL
Anesthesia: Spinal | Site: Knee | Laterality: Left

## 2018-09-07 MED ORDER — LOSARTAN POTASSIUM 50 MG PO TABS
100.0000 mg | ORAL_TABLET | Freq: Every day | ORAL | Status: DC
  Administered 2018-09-08: 10:00:00 100 mg via ORAL
  Filled 2018-09-07: qty 2

## 2018-09-07 MED ORDER — EPHEDRINE 5 MG/ML INJ
INTRAVENOUS | Status: AC
  Filled 2018-09-07: qty 10

## 2018-09-07 MED ORDER — MENTHOL 3 MG MT LOZG: 1.0000 | LOZENGE | OROMUCOSAL | Status: DC | PRN

## 2018-09-07 MED ORDER — LIDOCAINE 2% (20 MG/ML) 5 ML SYRINGE
INTRAMUSCULAR | Status: DC | PRN
  Administered 2018-09-07: 75 mg via INTRAVENOUS

## 2018-09-07 MED ORDER — DEXAMETHASONE SODIUM PHOSPHATE 10 MG/ML IJ SOLN
10.0000 mg | Freq: Once | INTRAMUSCULAR | Status: AC
  Administered 2018-09-08: 08:00:00 10 mg via INTRAVENOUS
  Filled 2018-09-07: qty 1

## 2018-09-07 MED ORDER — MORPHINE SULFATE (PF) 2 MG/ML IV SOLN: 0.5000 mg | INTRAVENOUS | Status: DC | PRN

## 2018-09-07 MED ORDER — HYDROCODONE-ACETAMINOPHEN 7.5-325 MG PO TABS: 1.0000 | ORAL_TABLET | ORAL | Status: DC | PRN

## 2018-09-07 MED ORDER — CLONIDINE HCL (ANALGESIA) 100 MCG/ML EP SOLN: EPIDURAL | Status: DC | PRN

## 2018-09-07 MED ORDER — PANTOPRAZOLE SODIUM 40 MG PO TBEC
40.0000 mg | DELAYED_RELEASE_TABLET | Freq: Every day | ORAL | Status: DC
  Administered 2018-09-08: 40 mg via ORAL
  Filled 2018-09-07: qty 1

## 2018-09-07 MED ORDER — LIDOCAINE 2% (20 MG/ML) 5 ML SYRINGE
INTRAMUSCULAR | Status: AC
  Filled 2018-09-07: qty 5

## 2018-09-07 MED ORDER — ONDANSETRON HCL 4 MG PO TABS: 4.0000 mg | ORAL_TABLET | Freq: Four times a day (QID) | ORAL | Status: DC | PRN

## 2018-09-07 MED ORDER — SODIUM CHLORIDE 0.9 % IR SOLN
Status: DC | PRN
  Administered 2018-09-07: 1000 mL

## 2018-09-07 MED ORDER — HYDROCODONE-ACETAMINOPHEN 5-325 MG PO TABS
1.0000 | ORAL_TABLET | ORAL | Status: DC | PRN
  Administered 2018-09-08: 1 via ORAL
  Filled 2018-09-07: qty 1

## 2018-09-07 MED ORDER — PROPOFOL 10 MG/ML IV BOLUS
INTRAVENOUS | Status: AC
  Filled 2018-09-07: qty 20

## 2018-09-07 MED ORDER — MIDAZOLAM HCL 2 MG/2ML IJ SOLN
1.0000 mg | INTRAMUSCULAR | Status: DC
  Filled 2018-09-07: qty 2

## 2018-09-07 MED ORDER — PHENOL 1.4 % MT LIQD: 1.0000 | OROMUCOSAL | Status: DC | PRN

## 2018-09-07 MED ORDER — DIPHENHYDRAMINE HCL 12.5 MG/5ML PO ELIX: 12.5000 mg | ORAL_SOLUTION | ORAL | Status: DC | PRN

## 2018-09-07 MED ORDER — METOCLOPRAMIDE HCL 5 MG/ML IJ SOLN: 5.0000 mg | Freq: Three times a day (TID) | INTRAMUSCULAR | Status: DC | PRN

## 2018-09-07 MED ORDER — ONDANSETRON HCL 4 MG/2ML IJ SOLN: 4.0000 mg | Freq: Four times a day (QID) | INTRAMUSCULAR | Status: DC | PRN

## 2018-09-07 MED ORDER — SPIRONOLACTONE 100 MG PO TABS
100.0000 mg | ORAL_TABLET | Freq: Every day | ORAL | Status: DC
  Administered 2018-09-08: 10:00:00 100 mg via ORAL
  Filled 2018-09-07: qty 1

## 2018-09-07 MED ORDER — CLONIDINE HCL 0.1 MG PO TABS
0.1000 mg | ORAL_TABLET | Freq: Two times a day (BID) | ORAL | Status: DC
  Administered 2018-09-08: 0.1 mg via ORAL
  Filled 2018-09-07: qty 1

## 2018-09-07 MED ORDER — SODIUM CHLORIDE 0.9 % IV SOLN
INTRAVENOUS | Status: DC
  Administered 2018-09-07: 18:00:00 via INTRAVENOUS

## 2018-09-07 MED ORDER — ACETAMINOPHEN 325 MG PO TABS: 325.0000 mg | ORAL_TABLET | Freq: Four times a day (QID) | ORAL | Status: DC | PRN

## 2018-09-07 MED ORDER — POLYETHYLENE GLYCOL 3350 17 G PO PACK
17.0000 g | PACK | Freq: Two times a day (BID) | ORAL | Status: DC
  Administered 2018-09-07 – 2018-09-08 (×2): 17 g via ORAL
  Filled 2018-09-07 (×2): qty 1

## 2018-09-07 MED ORDER — CEFAZOLIN SODIUM-DEXTROSE 2-4 GM/100ML-% IV SOLN
2.0000 g | Freq: Four times a day (QID) | INTRAVENOUS | Status: AC
  Administered 2018-09-07 (×2): 2 g via INTRAVENOUS
  Filled 2018-09-07 (×2): qty 100

## 2018-09-07 MED ORDER — METHOCARBAMOL 500 MG IVPB - SIMPLE MED
500.0000 mg | Freq: Four times a day (QID) | INTRAVENOUS | Status: DC | PRN
  Administered 2018-09-07: 500 mg via INTRAVENOUS
  Filled 2018-09-07: qty 50

## 2018-09-07 MED ORDER — METOCLOPRAMIDE HCL 5 MG PO TABS: 5.0000 mg | ORAL_TABLET | Freq: Three times a day (TID) | ORAL | Status: DC | PRN

## 2018-09-07 MED ORDER — BUPIVACAINE IN DEXTROSE 0.75-8.25 % IT SOLN
INTRATHECAL | Status: DC | PRN
  Administered 2018-09-07: 1.6 mL via INTRATHECAL

## 2018-09-07 MED ORDER — 0.9 % SODIUM CHLORIDE (POUR BTL) OPTIME
TOPICAL | Status: DC | PRN
  Administered 2018-09-07: 1000 mL

## 2018-09-07 MED ORDER — POVIDONE-IODINE 10 % EX SWAB
2.0000 "application " | Freq: Once | CUTANEOUS | Status: AC
  Administered 2018-09-07: 2 via TOPICAL

## 2018-09-07 MED ORDER — ASPIRIN 81 MG PO CHEW
81.0000 mg | CHEWABLE_TABLET | Freq: Two times a day (BID) | ORAL | Status: DC
  Administered 2018-09-07 – 2018-09-08 (×2): 81 mg via ORAL
  Filled 2018-09-07 (×2): qty 1

## 2018-09-07 MED ORDER — PRAVASTATIN SODIUM 20 MG PO TABS
80.0000 mg | ORAL_TABLET | Freq: Every evening | ORAL | Status: DC
  Administered 2018-09-07: 80 mg via ORAL
  Filled 2018-09-07: qty 4

## 2018-09-07 MED ORDER — METHOCARBAMOL 500 MG IVPB - SIMPLE MED
INTRAVENOUS | Status: AC
  Filled 2018-09-07: qty 50

## 2018-09-07 MED ORDER — CEFAZOLIN SODIUM-DEXTROSE 2-4 GM/100ML-% IV SOLN
2.0000 g | INTRAVENOUS | Status: AC
  Administered 2018-09-07: 12:00:00 2 g via INTRAVENOUS
  Filled 2018-09-07: qty 100

## 2018-09-07 MED ORDER — ONDANSETRON HCL 4 MG/2ML IJ SOLN: 4.0000 mg | Freq: Once | INTRAMUSCULAR | Status: DC | PRN

## 2018-09-07 MED ORDER — CHLORHEXIDINE GLUCONATE 4 % EX LIQD: 60.0000 mL | Freq: Once | CUTANEOUS | Status: DC

## 2018-09-07 MED ORDER — ALUM & MAG HYDROXIDE-SIMETH 200-200-20 MG/5ML PO SUSP: 15.0000 mL | ORAL | Status: DC | PRN

## 2018-09-07 MED ORDER — TRANEXAMIC ACID-NACL 1000-0.7 MG/100ML-% IV SOLN
1000.0000 mg | INTRAVENOUS | Status: AC
  Administered 2018-09-07: 1000 mg via INTRAVENOUS
  Filled 2018-09-07: qty 100

## 2018-09-07 MED ORDER — CLONIDINE HCL (ANALGESIA) 100 MCG/ML EP SOLN
EPIDURAL | Status: DC | PRN
  Administered 2018-09-07: 100 ug

## 2018-09-07 MED ORDER — BUPIVACAINE-EPINEPHRINE (PF) 0.25% -1:200000 IJ SOLN
INTRAMUSCULAR | Status: DC | PRN
  Administered 2018-09-07: 30 mL

## 2018-09-07 MED ORDER — PROPOFOL 500 MG/50ML IV EMUL
INTRAVENOUS | Status: DC | PRN
  Administered 2018-09-07: 50 ug/kg/min via INTRAVENOUS

## 2018-09-07 MED ORDER — ONDANSETRON HCL 4 MG/2ML IJ SOLN
INTRAMUSCULAR | Status: DC | PRN
  Administered 2018-09-07: 4 mg via INTRAVENOUS

## 2018-09-07 MED ORDER — CELECOXIB 200 MG PO CAPS
200.0000 mg | ORAL_CAPSULE | Freq: Two times a day (BID) | ORAL | Status: DC
  Administered 2018-09-07: 200 mg via ORAL
  Filled 2018-09-07: qty 1

## 2018-09-07 MED ORDER — FENTANYL CITRATE (PF) 100 MCG/2ML IJ SOLN
50.0000 ug | INTRAMUSCULAR | Status: DC
  Administered 2018-09-07: 11:00:00 100 ug via INTRAVENOUS
  Filled 2018-09-07: qty 2

## 2018-09-07 MED ORDER — ROPIVACAINE HCL 7.5 MG/ML IJ SOLN
INTRAMUSCULAR | Status: DC | PRN
  Administered 2018-09-07 (×4): 5 mL via PERINEURAL

## 2018-09-07 MED ORDER — KETOROLAC TROMETHAMINE 30 MG/ML IJ SOLN
INTRAMUSCULAR | Status: DC | PRN
  Administered 2018-09-07: 30 mg

## 2018-09-07 MED ORDER — KETOROLAC TROMETHAMINE 15 MG/ML IJ SOLN: 15.0000 mg | Freq: Once | INTRAMUSCULAR | Status: AC

## 2018-09-07 MED ORDER — CARBAMAZEPINE 200 MG PO TABS
200.0000 mg | ORAL_TABLET | Freq: Two times a day (BID) | ORAL | Status: DC
  Administered 2018-09-07 – 2018-09-08 (×2): 200 mg via ORAL
  Filled 2018-09-07 (×3): qty 1

## 2018-09-07 MED ORDER — FERROUS SULFATE 325 (65 FE) MG PO TABS
325.0000 mg | ORAL_TABLET | Freq: Two times a day (BID) | ORAL | Status: DC
  Administered 2018-09-07 – 2018-09-08 (×2): 325 mg via ORAL
  Filled 2018-09-07 (×2): qty 1

## 2018-09-07 MED ORDER — EPHEDRINE SULFATE-NACL 50-0.9 MG/10ML-% IV SOSY
PREFILLED_SYRINGE | INTRAVENOUS | Status: DC | PRN
  Administered 2018-09-07 (×2): 10 mg via INTRAVENOUS

## 2018-09-07 MED ORDER — MAGNESIUM CITRATE PO SOLN: 1.0000 | Freq: Once | ORAL | Status: DC | PRN

## 2018-09-07 MED ORDER — BISACODYL 10 MG RE SUPP: 10.0000 mg | Freq: Every day | RECTAL | Status: DC | PRN

## 2018-09-07 MED ORDER — LACTATED RINGERS IV SOLN
INTRAVENOUS | Status: DC
  Administered 2018-09-07 (×2): via INTRAVENOUS

## 2018-09-07 MED ORDER — STERILE WATER FOR IRRIGATION IR SOLN
Status: DC | PRN
  Administered 2018-09-07: 2000 mL

## 2018-09-07 MED ORDER — METHOCARBAMOL 500 MG PO TABS
500.0000 mg | ORAL_TABLET | Freq: Four times a day (QID) | ORAL | Status: DC | PRN
  Administered 2018-09-08: 10:00:00 500 mg via ORAL
  Filled 2018-09-07: qty 1

## 2018-09-07 MED ORDER — DEXAMETHASONE SODIUM PHOSPHATE 10 MG/ML IJ SOLN
10.0000 mg | Freq: Once | INTRAMUSCULAR | Status: AC
  Administered 2018-09-07: 12:00:00 10 mg via INTRAVENOUS

## 2018-09-07 MED ORDER — SODIUM CHLORIDE (PF) 0.9 % IJ SOLN
INTRAMUSCULAR | Status: DC | PRN
  Administered 2018-09-07: 30 mL

## 2018-09-07 MED ORDER — SODIUM CHLORIDE (PF) 0.9 % IJ SOLN
INTRAMUSCULAR | Status: AC
  Filled 2018-09-07: qty 50

## 2018-09-07 MED ORDER — MEPERIDINE HCL 50 MG/ML IJ SOLN: 6.2500 mg | INTRAMUSCULAR | Status: DC | PRN

## 2018-09-07 MED ORDER — KETOROLAC TROMETHAMINE 30 MG/ML IJ SOLN
INTRAMUSCULAR | Status: AC
  Filled 2018-09-07: qty 1

## 2018-09-07 MED ORDER — BUPIVACAINE-EPINEPHRINE (PF) 0.25% -1:200000 IJ SOLN
INTRAMUSCULAR | Status: AC
  Filled 2018-09-07: qty 30

## 2018-09-07 MED ORDER — DOCUSATE SODIUM 100 MG PO CAPS
100.0000 mg | ORAL_CAPSULE | Freq: Two times a day (BID) | ORAL | Status: DC
  Administered 2018-09-07 – 2018-09-08 (×2): 100 mg via ORAL
  Filled 2018-09-07 (×2): qty 1

## 2018-09-07 MED ORDER — AMLODIPINE BESYLATE 5 MG PO TABS
2.5000 mg | ORAL_TABLET | Freq: Every day | ORAL | Status: DC
  Administered 2018-09-08: 10:00:00 2.5 mg via ORAL
  Filled 2018-09-07: qty 1

## 2018-09-07 MED ORDER — TRANEXAMIC ACID-NACL 1000-0.7 MG/100ML-% IV SOLN
1000.0000 mg | Freq: Once | INTRAVENOUS | Status: AC
  Administered 2018-09-07: 1000 mg via INTRAVENOUS
  Filled 2018-09-07: qty 100

## 2018-09-07 MED ORDER — HYDROMORPHONE HCL 1 MG/ML IJ SOLN: 0.2500 mg | INTRAMUSCULAR | Status: DC | PRN

## 2018-09-07 MED ORDER — PROPOFOL 10 MG/ML IV BOLUS
INTRAVENOUS | Status: AC
  Filled 2018-09-07: qty 40

## 2018-09-07 SURGICAL SUPPLY — 62 items
ATTUNE MED ANAT PAT 38 KNEE (Knees) ×2 IMPLANT
ATTUNE MED ANAT PAT 38MM KNEE (Knees) ×1 IMPLANT
ATTUNE PS FEM LT SZ 6 CEM KNEE (Femur) ×3 IMPLANT
ATTUNE PSRP INSR SZ6 8 KNEE (Insert) ×2 IMPLANT
ATTUNE PSRP INSR SZ6 8MM KNEE (Insert) ×1 IMPLANT
BAG ZIPLOCK 12X15 (MISCELLANEOUS) IMPLANT
BASE TIBIA ATTUNE KNEE SYS SZ6 (Knees) ×1 IMPLANT
BLADE SAW SGTL 11.0X1.19X90.0M (BLADE) ×3 IMPLANT
BLADE SAW SGTL 13.0X1.19X90.0M (BLADE) ×3 IMPLANT
BLADE SURG SZ10 CARB STEEL (BLADE) ×6 IMPLANT
BNDG ELASTIC 6X5.8 VLCR STR LF (GAUZE/BANDAGES/DRESSINGS) ×3 IMPLANT
BOWL SMART MIX CTS (DISPOSABLE) ×3 IMPLANT
CEMENT HV SMART SET (Cement) ×6 IMPLANT
COVER SURGICAL LIGHT HANDLE (MISCELLANEOUS) ×3 IMPLANT
COVER WAND RF STERILE (DRAPES) IMPLANT
CUFF TOURN SGL QUICK 34 (TOURNIQUET CUFF) ×2
CUFF TRNQT CYL 34X4.125X (TOURNIQUET CUFF) ×1 IMPLANT
DECANTER SPIKE VIAL GLASS SM (MISCELLANEOUS) ×6 IMPLANT
DERMABOND ADVANCED (GAUZE/BANDAGES/DRESSINGS) ×2
DERMABOND ADVANCED .7 DNX12 (GAUZE/BANDAGES/DRESSINGS) ×1 IMPLANT
DRAPE U-SHAPE 47X51 STRL (DRAPES) ×3 IMPLANT
DRESSING AQUACEL AG SP 3.5X10 (GAUZE/BANDAGES/DRESSINGS) ×1 IMPLANT
DRSG AQUACEL AG SP 3.5X10 (GAUZE/BANDAGES/DRESSINGS) ×3
DURAPREP 26ML APPLICATOR (WOUND CARE) ×6 IMPLANT
ELECT PENCIL ROCKER SW 15FT (MISCELLANEOUS) ×3 IMPLANT
ELECT REM PT RETURN 15FT ADLT (MISCELLANEOUS) ×3 IMPLANT
GLOVE BIO SURGEON STRL SZ 6 (GLOVE) ×3 IMPLANT
GLOVE BIOGEL PI IND STRL 6.5 (GLOVE) ×1 IMPLANT
GLOVE BIOGEL PI IND STRL 7.5 (GLOVE) ×1 IMPLANT
GLOVE BIOGEL PI IND STRL 8.5 (GLOVE) ×1 IMPLANT
GLOVE BIOGEL PI INDICATOR 6.5 (GLOVE) ×2
GLOVE BIOGEL PI INDICATOR 7.5 (GLOVE) ×2
GLOVE BIOGEL PI INDICATOR 8.5 (GLOVE) ×2
GLOVE ECLIPSE 8.0 STRL XLNG CF (GLOVE) ×3 IMPLANT
GLOVE ORTHO TXT STRL SZ7.5 (GLOVE) ×3 IMPLANT
GOWN STRL REUS W/ TWL LRG LVL3 (GOWN DISPOSABLE) ×1 IMPLANT
GOWN STRL REUS W/TWL 2XL LVL3 (GOWN DISPOSABLE) ×3 IMPLANT
GOWN STRL REUS W/TWL LRG LVL3 (GOWN DISPOSABLE) ×5 IMPLANT
HANDPIECE INTERPULSE COAX TIP (DISPOSABLE) ×2
HOLDER FOLEY CATH W/STRAP (MISCELLANEOUS) ×3 IMPLANT
KIT TURNOVER KIT A (KITS) IMPLANT
MANIFOLD NEPTUNE II (INSTRUMENTS) ×3 IMPLANT
NDL SAFETY ECLIPSE 18X1.5 (NEEDLE) ×1 IMPLANT
NEEDLE HYPO 18GX1.5 SHARP (NEEDLE) ×2
NS IRRIG 1000ML POUR BTL (IV SOLUTION) ×3 IMPLANT
PACK TOTAL KNEE CUSTOM (KITS) ×3 IMPLANT
PIN DRILL FIX HALF THREAD (BIT) ×3 IMPLANT
PIN FIX SIGMA LCS THRD HI (PIN) ×3 IMPLANT
PROTECTOR NERVE ULNAR (MISCELLANEOUS) ×3 IMPLANT
SET HNDPC FAN SPRY TIP SCT (DISPOSABLE) ×1 IMPLANT
SET PAD KNEE POSITIONER (MISCELLANEOUS) ×3 IMPLANT
SUT MNCRL AB 4-0 PS2 18 (SUTURE) ×3 IMPLANT
SUT STRATAFIX PDS+ 0 24IN (SUTURE) ×3 IMPLANT
SUT VIC AB 1 CT1 36 (SUTURE) ×3 IMPLANT
SUT VIC AB 2-0 CT1 27 (SUTURE) ×4
SUT VIC AB 2-0 CT1 TAPERPNT 27 (SUTURE) ×2 IMPLANT
SYR 3ML LL SCALE MARK (SYRINGE) ×3 IMPLANT
TIBIA ATTUNE KNEE SYS BASE SZ6 (Knees) ×3 IMPLANT
TRAY FOLEY MTR SLVR 16FR STAT (SET/KITS/TRAYS/PACK) ×3 IMPLANT
WATER STERILE IRR 1000ML POUR (IV SOLUTION) ×6 IMPLANT
WRAP KNEE MAXI GEL POST OP (GAUZE/BANDAGES/DRESSINGS) ×3 IMPLANT
YANKAUER SUCT BULB TIP 10FT TU (MISCELLANEOUS) ×3 IMPLANT

## 2018-09-07 NOTE — Transfer of Care (Signed)
Immediate Anesthesia Transfer of Care Note  Patient: Patrick Carey  Procedure(s) Performed: TOTAL KNEE ARTHROPLASTY (Left Knee)  Patient Location: PACU  Anesthesia Type:Regional and Spinal  Level of Consciousness: awake, alert  and oriented  Airway & Oxygen Therapy: Patient Spontanous Breathing  Post-op Assessment: Report given to RN and Post -op Vital signs reviewed and stable  Post vital signs: Reviewed and stable  Last Vitals:  Vitals Value Taken Time  BP 116/90 09/07/18 1338  Temp    Pulse 64 09/07/18 1341  Resp 13 09/07/18 1341  SpO2 99 % 09/07/18 1341  Vitals shown include unvalidated device data.  Last Pain:  Vitals:   09/07/18 1106  TempSrc:   PainSc: 0-No pain         Complications: No apparent anesthesia complications

## 2018-09-07 NOTE — Anesthesia Procedure Notes (Addendum)
Anesthesia Regional Block: Adductor canal block   Pre-Anesthetic Checklist: ,, timeout performed, Correct Patient, Correct Site, Correct Laterality, Correct Procedure, Correct Position, site marked, Risks and benefits discussed,  Surgical consent,  Pre-op evaluation,  At surgeon's request and post-op pain management  Laterality: Lower and Left  Prep: chloraprep       Needles:  Injection technique: Single-shot  Needle Type: Echogenic Stimulator Needle     Needle Length: 10cm  Needle Gauge: 21   Needle insertion depth: 3 cm   Additional Needles:   Procedures:,,,, ultrasound used (permanent image in chart),,,,  Narrative:  Start time: 09/07/2018 10:58 AM End time: 09/07/2018 11:08 AM Injection made incrementally with aspirations every 5 mL. Anesthesiologist: Lyn Hollingshead, MD

## 2018-09-07 NOTE — Evaluation (Signed)
Physical Therapy Evaluation Patient Details Name: Patrick Carey MRN: [id number] DOB: 02/14/1933 Today's Date: 09/07/2018   History of Present Illness  85 yo male s/p L TKR on 09/07/18. PMH includes prostate cancer, HLD, HTN, sleep apnea.  Clinical Impression  Pt presents with mild to moderate L knee pain, decreased L knee ROM, post-operative LLE weakness, increased time and effort to perform mobility tasks, and decreased activity tolerance due to LLE pain and fatigue. Pt to benefit from acute PT to address deficits. Pt ambulated hallway distance with rw with min guard assist, verbal cuing for sequencing and form provided. Pt educated on ankle pumps (20/hour) to perform this afternoon/evening to increase circulation, to pt's tolerance and limited by pain. PT to progress mobility as tolerated, and will continue to follow acutely.        Follow Up Recommendations Follow surgeon's recommendation for DC plan and follow-up therapies;Supervision for mobility/OOB(OPPT)    Equipment Recommendations  Rolling walker with 5" wheels    Recommendations for Other Services       Precautions / Restrictions Precautions Precautions: Fall Restrictions Weight Bearing Restrictions: No LLE Weight Bearing: Weight bearing as tolerated      Mobility  Bed Mobility Overal bed mobility: Needs Assistance Bed Mobility: Supine to Sit     Supine to sit: Min guard;HOB elevated     General bed mobility comments: min guard for safety, verbal cuing for sequencing to EOB. Increased time and effort.  Transfers Overall transfer level: Needs assistance Equipment used: Rolling walker (2 wheeled) Transfers: Sit to/from Stand Sit to Stand: Min guard;From elevated surface         General transfer comment: Min guard for safety, verbal cuing for hand placement when rising.  Ambulation/Gait Ambulation/Gait assistance: Min guard Gait Distance (Feet): 65 Feet Assistive device: Rolling walker (2 wheeled) Gait  Pattern/deviations: Step-to pattern;Step-through pattern;Decreased stride length;Trunk flexed Gait velocity: decr   General Gait Details: min guard for safety, close guarding LLE in case of LLE weakness. Verbal cuing for sequencing, placement in RW, upright posture, and turning with RW.  Stairs            Wheelchair Mobility    Modified Rankin (Stroke Patients Only)       Balance Overall balance assessment: Mild deficits observed, not formally tested                                           Pertinent Vitals/Pain Pain Assessment: 0-10 Pain Score: 3  Pain Location: L knee Pain Descriptors / Indicators: Sore Pain Intervention(s): Limited activity within patient's tolerance;Monitored during session;Premedicated before session;Repositioned;Ice applied    Home Living Family/patient expects to be discharged to:: Private residence Living Arrangements: Alone Available Help at Discharge: Family;Available 24 hours/day(daughter to stay with pt) Type of Home: House Home Access: Ramped entrance     Home Layout: One level Home Equipment: Walker - 4 wheels;Cane - single point;Bedside commode      Prior Function Level of Independence: Independent         Comments: pt upholsters furniture 6 hours a day     Hand Dominance   Dominant Hand: Right    Extremity/Trunk Assessment   Upper Extremity Assessment Upper Extremity Assessment: Overall WFL for tasks assessed    Lower Extremity Assessment Lower Extremity Assessment: Overall WFL for tasks assessed;LLE deficits/detail LLE Deficits / Details: suspected post-surgical weakness; able to perform ankle  pumps, quad set, heel slide to 45* limited by pain, and SLR with 10* quad lag LLE Sensation: WNL    Cervical / Trunk Assessment Cervical / Trunk Assessment: Normal  Communication   Communication: No difficulties  Cognition Arousal/Alertness: Awake/alert Behavior During Therapy: WFL for tasks  assessed/performed Overall Cognitive Status: Within Functional Limits for tasks assessed                                        General Comments      Exercises     Assessment/Plan    PT Assessment Patient needs continued PT services  PT Problem List Decreased strength;Decreased mobility;Decreased range of motion;Decreased activity tolerance;Decreased balance;Decreased knowledge of use of DME;Pain       PT Treatment Interventions DME instruction;Therapeutic activities;Gait training;Therapeutic exercise;Patient/family education;Balance training;Functional mobility training    PT Goals (Current goals can be found in the Care Plan section)  Acute Rehab PT Goals PT Goal Formulation: With patient Time For Goal Achievement: 09/14/18 Potential to Achieve Goals: Good    Frequency 7X/week   Barriers to discharge        Co-evaluation               AM-PAC PT "6 Clicks" Mobility  Outcome Measure Help needed turning from your back to your side while in a flat bed without using bedrails?: A Little Help needed moving from lying on your back to sitting on the side of a flat bed without using bedrails?: A Little Help needed moving to and from a bed to a chair (including a wheelchair)?: A Little Help needed standing up from a chair using your arms (e.g., wheelchair or bedside chair)?: A Little Help needed to walk in hospital room?: A Little Help needed climbing 3-5 steps with a railing? : A Little 6 Click Score: 18    End of Session Equipment Utilized During Treatment: Gait belt Activity Tolerance: Patient tolerated treatment well;Patient limited by fatigue Patient left: in chair;with chair alarm set;with call bell/phone within reach;with SCD's reapplied Nurse Communication: Mobility status PT Visit Diagnosis: Other abnormalities of gait and mobility (R26.89);Difficulty in walking, not elsewhere classified (R26.2)    Time: 1835-1901 PT Time Calculation (min)  (ACUTE ONLY): 26 min   Charges:   PT Evaluation $PT Eval Low Complexity: 1 Low PT Treatments $Gait Training: 8-22 mins      Julien Girt, PT Acute Rehabilitation Services Pager [phone]  Office [phone]   Brandalynn Ofallon D Elonda Husky 09/07/2018, 7:17 PM

## 2018-09-07 NOTE — Anesthesia Preprocedure Evaluation (Signed)
Anesthesia Evaluation  Patient identified by MRN, date of birth, ID band Patient awake    Reviewed: Allergy & Precautions, NPO status , Patient's Chart, lab work & pertinent test results  Airway Mallampati: I       Dental no notable dental hx. (+) Teeth Intact   Pulmonary    Pulmonary exam normal breath sounds clear to auscultation       Cardiovascular hypertension, Pt. on medications Normal cardiovascular exam Rhythm:Regular Rate:Normal     Neuro/Psych negative neurological ROS  negative psych ROS   GI/Hepatic negative GI ROS, Neg liver ROS,   Endo/Other  negative endocrine ROS  Renal/GU negative Renal ROS     Musculoskeletal  (+) Arthritis , Osteoarthritis,    Abdominal Normal abdominal exam  (+)   Peds  Hematology negative hematology ROS (+)   Anesthesia Other Findings   Reproductive/Obstetrics                             Anesthesia Physical Anesthesia Plan  ASA: II  Anesthesia Plan: Spinal   Post-op Pain Management:  Regional for Post-op pain   Induction:   PONV Risk Score and Plan: 1 and Ondansetron  Airway Management Planned: Nasal Cannula, Natural Airway and Simple Face Mask  Additional Equipment:   Intra-op Plan:   Post-operative Plan:   Informed Consent: I have reviewed the patients History and Physical, chart, labs and discussed the procedure including the risks, benefits and alternatives for the proposed anesthesia with the patient or authorized representative who has indicated his/her understanding and acceptance.       Plan Discussed with: CRNA  Anesthesia Plan Comments:         Anesthesia Quick Evaluation

## 2018-09-07 NOTE — Op Note (Signed)
NAME:  Patrick Carey                      MEDICAL RECORD NO.:  [id number]                             FACILITY:  [facility]      PHYSICIAN:  Pietro Cassis. Alvan Dame, M.D.  DATE OF BIRTH:  11/03/1933      DATE OF PROCEDURE:  09/07/2018                                     OPERATIVE REPORT         PREOPERATIVE DIAGNOSIS:  Left knee osteoarthritis.      POSTOPERATIVE DIAGNOSIS:  Left knee osteoarthritis.      FINDINGS:  The patient was noted to have complete loss of cartilage and   bone-on-bone arthritis with associated osteophytes in the medial and patellofemoral compartments of   the knee with a significant synovitis and associated effusion.  The patient had failed months of conservative treatment including medications, injection therapy, activity modification.     PROCEDURE:  Left total knee replacement.      COMPONENTS USED:  DePuy Attune rotating platform posterior stabilized knee   system, a size 7 femur, 6 tibia, size 8 mm PS AOX insert, and 38 anatomic patellar   button.      SURGEON:  Pietro Cassis. Alvan Dame, M.D.      ASSISTANT:  Danae Orleans, PA-C.      ANESTHESIA:  Regional and Spinal.      SPECIMENS:  None.      COMPLICATION:  None.      DRAINS:  None.  EBL: <100cc      TOURNIQUET TIME:   Total Tourniquet Time Documented: Thigh (Left) - 34 minutes Total: Thigh (Left) - 34 minutes  .      The patient was stable to the recovery room.      INDICATION FOR PROCEDURE:  Patrick Carey is a 85 y.o. male patient of   mine.  The patient had been seen, evaluated, and treated for months conservatively in the   office with medication, activity modification, and injections.  The patient had   radiographic changes of bone-on-bone arthritis with endplate sclerosis and osteophytes noted.  Based on the radiographic changes and failed conservative measures, the patient   decided to proceed with definitive treatment, total knee replacement.  Risks of infection, DVT, component failure, need for  revision surgery, neurovascular injury were reviewed in the office setting.  The postop course was reviewed stressing the efforts to maximize post-operative satisfaction and function.  Consent was obtained for benefit of pain   relief.      PROCEDURE IN DETAIL:  The patient was brought to the operative theater.   Once adequate anesthesia, preoperative antibiotics, 2 gm of Ancef,1 gm of Tranexamic Acid, and 10 mg of Decadron administered, the patient was positioned supine with a left thigh tourniquet placed.  The  left lower extremity was prepped and draped in sterile fashion.  A time-   out was performed identifying the patient, planned procedure, and the appropriate extremity.      The left lower extremity was placed in the [facility] leg holder.  The leg was   exsanguinated, tourniquet elevated to 250 mmHg.  A midline incision was   made  followed by median parapatellar arthrotomy.  Following initial   exposure, attention was first directed to the patella.  Precut   measurement was noted to be 26 mm.  I resected down to 14 mm and used a   38 anatomic patellar button to restore patellar height as well as cover the cut surface.      The lug holes were drilled and a metal shim was placed to protect the   patella from retractors and saw blade during the procedure.      At this point, attention was now directed to the femur.  The femoral   canal was opened with a drill, irrigated to try to prevent fat emboli.  An   intramedullary rod was passed at 5 degrees valgus, 11 mm of bone was   resected off the distal femur.  Following this resection, the tibia was   subluxated anteriorly.  Using the extramedullary guide, 2 mm of bone was resected off   the proximal medial tibia.  We confirmed the gap would be   stable medially and laterally with a size 6 spacer block as well as confirmed that the tibial cut was perpendicular in the coronal plane, checking with an alignment rod.      Once this was done, I  sized the femur to be a size 7 in the anterior-   posterior dimension, chose a standard component based on medial and   lateral dimension.  The size 7 rotation block was then pinned in   position anterior referenced using the C-clamp to set rotation.  The   anterior, posterior, and  chamfer cuts were made without difficulty nor   notching making certain that I was along the anterior cortex to help   with flexion gap stability.      The final box cut was made off the lateral aspect of distal femur.      At this point, the tibia was sized to be a size 6.  The size 6 tray was   then pinned in position through the medial third of the tubercle,   drilled, and keel punched.  Trial reduction was now carried with a 7 femur,  6 tibia, a size 8 mm PS insert, and the 38 anatomic patella botton.  The knee was brought to full extension with good flexion stability with the patella   tracking through the trochlea without application of pressure.  Given   all these findings the trial components removed.  Final components were   opened and cement was mixed.  The knee was irrigated with normal saline solution and pulse lavage.  The synovial lining was   then injected with 30 cc of 0.25% Marcaine with epinephrine, 1 cc of Toradol and 30 cc of NS for a total of 61 cc.     Final implants were then cemented onto cleaned and dried cut surfaces of bone with the knee brought to extension with a size 8 mm PS trial insert.      Once the cement had fully cured, excess cement was removed   throughout the knee.  I confirmed that I was satisfied with the range of   motion and stability, and the final size 8 mm PS AOX insert was chosen.  It was   placed into the knee.      The tourniquet had been let down at 34 minutes.  No significant   hemostasis was required.  The extensor mechanism was then reapproximated using #1 Vicryl and #  1 Stratafix sutures with the knee   in flexion.  The   remaining wound was closed with 2-0  Vicryl and running 4-0 Monocryl.   The knee was cleaned, dried, dressed sterilely using Dermabond and   Aquacel dressing.  The patient was then   brought to recovery room in stable condition, tolerating the procedure   well.   Please note that Physician Assistant, Danae Orleans, PA-C was present for the entirety of the case, and was utilized for pre-operative positioning, peri-operative retractor management, general facilitation of the procedure and for primary wound closure at the end of the case.              Pietro Cassis Alvan Dame, M.D.    09/07/2018 1:08 PM

## 2018-09-07 NOTE — Interval H&P Note (Signed)
History and Physical Interval Note:  09/07/2018 9:06 AM  Patrick Carey  has presented today for surgery, with the diagnosis of Left knee osteoarthritis.  The various methods of treatment have been discussed with the patient and family. After consideration of risks, benefits and other options for treatment, the patient has consented to  Procedure(s) with comments: TOTAL KNEE ARTHROPLASTY (Left) - 70 mins as a surgical intervention.  The patient's history has been reviewed, patient examined, no change in status, stable for surgery.  I have reviewed the patient's chart and labs.  Questions were answered to the patient's satisfaction.     Mauri Pole

## 2018-09-07 NOTE — Progress Notes (Signed)
Assisted Dr. Hatchett with left, ultrasound guided, adductor canal block. Side rails up, monitors on throughout procedure. See vital signs in flow sheet. Tolerated Procedure well. 

## 2018-09-07 NOTE — Anesthesia Procedure Notes (Signed)
Spinal  Patient location during procedure: OR Start time: 09/07/2018 11:36 AM End time: 09/07/2018 11:38 AM Staffing Anesthesiologist: Lyn Hollingshead, MD Performed: anesthesiologist  Preanesthetic Checklist Completed: patient identified, site marked, surgical consent, pre-op evaluation, timeout performed, IV checked, risks and benefits discussed and monitors and equipment checked Spinal Block Patient position: sitting Prep: site prepped and draped and DuraPrep Patient monitoring: continuous pulse ox and blood pressure Approach: midline Location: L3-4 Injection technique: single-shot Needle Needle type: Pencan  Needle gauge: 24 G Needle length: 10 cm Needle insertion depth: 5 cm Assessment Sensory level: T8

## 2018-09-07 NOTE — Discharge Instructions (Signed)

## 2018-09-07 NOTE — Plan of Care (Signed)
Progressing, up in chair with PT this afternoon

## 2018-09-08 ENCOUNTER — Encounter (HOSPITAL_COMMUNITY): Payer: Self-pay | Admitting: Orthopedic Surgery

## 2018-09-08 DIAGNOSIS — M1712 Unilateral primary osteoarthritis, left knee: Secondary | ICD-10-CM | POA: Diagnosis not present

## 2018-09-08 LAB — BASIC METABOLIC PANEL
Anion gap: 10 (ref 5–15)
BUN: 26 mg/dL — ABNORMAL HIGH (ref 8–23)
CO2: 25 mmol/L (ref 22–32)
Calcium: 8.6 mg/dL — ABNORMAL LOW (ref 8.9–10.3)
Chloride: 101 mmol/L (ref 98–111)
Creatinine, Ser: 1.03 mg/dL (ref 0.61–1.24)
GFR calc Af Amer: >60 mL/min (ref >60)
GFR calc non Af Amer: >60 mL/min (ref >60)
Glucose, Bld: 125 mg/dL — ABNORMAL HIGH (ref 70–99)
Potassium: 4.3 mmol/L (ref 3.5–5.1)
Sodium: 136 mmol/L (ref 135–145)

## 2018-09-08 LAB — CBC
HCT: 34.4 % — ABNORMAL LOW (ref 39.0–52.0)
Hemoglobin: 11.2 g/dL — ABNORMAL LOW (ref 13.0–17.0)
MCH: 30.6 pg (ref 26.0–34.0)
MCHC: 32.6 g/dL (ref 30.0–36.0)
MCV: 94 fL (ref 80.0–100.0)
Platelets: 178 10*3/uL (ref 150–400)
RBC: 3.66 MIL/uL — ABNORMAL LOW (ref 4.22–5.81)
RDW: 12.7 % (ref 11.5–15.5)
WBC: 15.5 10*3/uL — ABNORMAL HIGH (ref 4.0–10.5)
nRBC: 0 % (ref 0.0–0.2)

## 2018-09-08 MED ORDER — FERROUS SULFATE 325 (65 FE) MG PO TABS: 325.0000 mg | ORAL_TABLET | Freq: Three times a day (TID) | ORAL | 0 refills | Status: DC

## 2018-09-08 MED ORDER — DOCUSATE SODIUM 100 MG PO CAPS: 100.0000 mg | ORAL_CAPSULE | Freq: Two times a day (BID) | ORAL | 0 refills | Status: DC

## 2018-09-08 MED ORDER — POLYETHYLENE GLYCOL 3350 17 G PO PACK: 17.0000 g | PACK | Freq: Two times a day (BID) | ORAL | 0 refills | Status: DC

## 2018-09-08 MED ORDER — METHOCARBAMOL 500 MG PO TABS: 500.0000 mg | ORAL_TABLET | Freq: Four times a day (QID) | ORAL | 0 refills | Status: DC | PRN

## 2018-09-08 MED ORDER — ASPIRIN 81 MG PO CHEW: 81.0000 mg | CHEWABLE_TABLET | Freq: Two times a day (BID) | ORAL | 0 refills | Status: AC

## 2018-09-08 MED ORDER — HYDROCODONE-ACETAMINOPHEN 5-325 MG PO TABS: 1.0000 | ORAL_TABLET | ORAL | 0 refills | Status: DC | PRN

## 2018-09-08 NOTE — Progress Notes (Signed)
     Subjective: 1 Day Post-Op Procedure(s) (LRB): TOTAL KNEE ARTHROPLASTY (Left)   Patient reports pain as mild, pain well controlled.  No reported events throughout the night.  Patient states that he feels that he worked well with therapy already.  We discussed the procedure, plan and expectations moving forward.  Patient will start therapy within 3 to 4 days.  Patient is ready be discharged home, if he does well with therapy.   Patient's anticipated LOS is less than 2 midnights, meeting these requirements: - Lives within 1 hour of care - Has a competent adult at home to recover with post-op recover - NO history of  - Chronic pain requiring opiods  - Diabetes  - Coronary Artery Disease  - Heart failure  - Heart attack  - Stroke  - DVT/VTE  - Cardiac arrhythmia  - Respiratory Failure/COPD  - Renal failure  - Anemia  - Advanced Liver disease     Objective:   VITALS:   Vitals:   09/08/18 0119 09/08/18 0502  BP: (!) 148/75 (!) 168/79  Pulse: (!) 57 66  Resp: 16 16  Temp: 97.6 F (36.4 C) 97.7 F (36.5 C)  SpO2: 99% 100%    Dorsiflexion/Plantar flexion intact Incision: dressing C/D/I No cellulitis present Compartment soft  LABS Recent Labs    09/08/18 0248  HGB 11.2*  HCT 34.4*  WBC 15.5*  PLT 178    Recent Labs    09/08/18 0248  NA 136  K 4.3  BUN 26*  CREATININE 1.03  GLUCOSE 125*     Assessment/Plan: 1 Day Post-Op Procedure(s) (LRB): TOTAL KNEE ARTHROPLASTY (Left) Foley cath d/c/ed Advance diet Up with therapy D/C IV fluids Discharge home Follow up in 2 weeks at [facility] Follow up with OLIN,Advaith Lamarque D in 2 weeks.  Contact information:  [facility] [address] [place] [phone]    Obese (BMI 30-39.9) Estimated body mass index is 30.13 kg/m as calculated from the following:   Height as of this encounter: 5\' 10"  (1.778 m).  Weight as of this encounter: 95.3 kg. Patient also counseled that weight may inhibit the healing process Patient counseled that losing weight will help with future health issues        West Pugh. Rayelle Armor   PAC  09/08/2018, 8:29 AM

## 2018-09-08 NOTE — TOC Progression Note (Addendum)
Transition of Care [facility]) - Progression Note    Patient Details  Name: Patrick Carey MRN: [id number] Date of Birth: 07/11/1933  Transition of Care [facility]) CM/SW [place], LCSW Phone Number: 09/08/2018, 11:10 AM  Clinical Narrative:    Therapy Plan: Outpatient PT CSW met with patient at bedside, patient has a rolator at home not RW. CSW to arrange DME-RW.  CSW reached out to [place] worker [phone] ext [phone]/ paged. Left voicemail   1:50pm-CSW faxed RW orders to: [phone]. [place] social worker to provide orders to Dr. Luvenia Starch for requested DME.    Barriers to Discharge: No Barriers Identified  Expected Discharge Plan and Services           Expected Discharge Date: 09/08/18                 DME Agency: [place]                 Social Determinants of Health (SDOH) Interventions    Readmission Risk Interventions No flowsheet data found.

## 2018-09-08 NOTE — Anesthesia Postprocedure Evaluation (Signed)
Anesthesia Post Note  Patient: Patrick Carey  Procedure(s) Performed: TOTAL KNEE ARTHROPLASTY (Left Knee)     Patient location during evaluation: PACU Anesthesia Type: Spinal Level of consciousness: awake Pain management: pain level controlled Vital Signs Assessment: post-procedure vital signs reviewed and stable Respiratory status: spontaneous breathing Cardiovascular status: stable Postop Assessment: no headache, no backache, spinal receding, patient able to bend at knees and no apparent nausea or vomiting Anesthetic complications: no    Last Vitals:  Vitals:   09/08/18 0502 09/08/18 0900  BP: (!) 168/79 (!) 165/72  Pulse: 66 66  Resp: 16 17  Temp: 36.5 C 36.5 C  SpO2: 100% 99%    Last Pain:  Vitals:   09/08/18 1104  TempSrc:   PainSc: 2    Pain Goal: Patients Stated Pain Goal: 3 (09/08/18 0949)                 Huston Foley

## 2018-09-08 NOTE — Progress Notes (Signed)
Physical Therapy Treatment Patient Details Name: Patrick Carey MRN: [id number] DOB: [birth year] Today's Date: 09/08/2018    History of Present Illness 85 yo male s/p L TKR on 09/07/18. PMH includes prostate cancer, HLD, HTN, sleep apnea.    PT Comments    Progressing well with mobility. Reviewed exercises, gait training, and stair training. Issued HEP for pt to perform 3x/day until OP PT begins. All education completed. Okay to d/c from PT standpoint.    Follow Up Recommendations  Follow surgeon's recommendation for DC plan and follow-up therapies;Supervision for mobility/OOB     Equipment Recommendations  Rolling walker with 5" wheels    Recommendations for Other Services       Precautions / Restrictions Precautions Precautions: Fall;Knee Restrictions Weight Bearing Restrictions: No LLE Weight Bearing: Weight bearing as tolerated    Mobility  Bed Mobility Overal bed mobility: Needs Assistance Bed Mobility: Supine to Sit     Supine to sit: Supervision     General bed mobility comments: for safety  Transfers Overall transfer level: Needs assistance Equipment used: Rolling walker (2 wheeled) Transfers: Sit to/from Stand Sit to Stand: Supervision         General transfer comment: for safety. VCs hand placement  Ambulation/Gait Ambulation/Gait assistance: Min guard Gait Distance (Feet): 125 Feet Assistive device: Rolling walker (2 wheeled) Gait Pattern/deviations: Step-through pattern;Step-to pattern;Decreased stride length     General Gait Details: for safety.   Stairs Stairs: Yes Stairs assistance: Min guard Stair Management: Step to pattern;Forwards;Two rails Number of Stairs: 2 General stair comments: up and over portable steps. VCs safety, technique, sequence.   Wheelchair Mobility    Modified Rankin (Stroke Patients Only)       Balance Overall balance assessment: Mild deficits observed, not formally tested                                           Cognition Arousal/Alertness: Awake/alert Behavior During Therapy: WFL for tasks assessed/performed Overall Cognitive Status: Within Functional Limits for tasks assessed                                        Exercises Total Joint Exercises Ankle Circles/Pumps: AROM;Both;10 reps;Supine Quad Sets: AROM;Both;10 reps;Supine Hip ABduction/ADduction: AROM;Left;10 reps;Supine Straight Leg Raises: AROM;Left;10 reps;Supine Long Arc Quad: AROM;Left;10 reps;Seated Knee Flexion: AROM;Left;10 reps;Seated Goniometric ROM: ~10-80 degrees    General Comments        Pertinent Vitals/Pain Pain Assessment: 0-10 Pain Score: 5  Pain Location: L knee Pain Descriptors / Indicators: Sore Pain Intervention(s): Monitored during session;Ice applied    Home Living                      Prior Function            PT Goals (current goals can now be found in the care plan section) Progress towards PT goals: Progressing toward goals    Frequency    7X/week      PT Plan Current plan remains appropriate    Co-evaluation              AM-PAC PT "6 Clicks" Mobility   Outcome Measure  Help needed turning from your back to your side while in a flat bed without using bedrails?: A Little Help needed moving  from lying on your back to sitting on the side of a flat bed without using bedrails?: A Little Help needed moving to and from a bed to a chair (including a wheelchair)?: A Little Help needed standing up from a chair using your arms (e.g., wheelchair or bedside chair)?: A Little Help needed to walk in hospital room?: A Little Help needed climbing 3-5 steps with a railing? : A Little 6 Click Score: 18    End of Session Equipment Utilized During Treatment: Gait belt Activity Tolerance: Patient tolerated treatment well Patient left: in chair;with call bell/phone within reach;with chair alarm set   PT Visit Diagnosis: Other abnormalities  of gait and mobility (R26.89)     Time: 5643-3295 PT Time Calculation (min) (ACUTE ONLY): 18 min  Charges:  $Gait Training: 8-22 mins                        Weston Anna, [place] Pager: [phone] Office: [phone]

## 2018-09-09 NOTE — Addendum Note (Signed)
Addendum  created 09/09/18 1104 by Lyn Hollingshead, MD   Clinical Note Signed, Intraprocedure Blocks edited

## 2018-09-17 NOTE — Discharge Summary (Signed)
Physician Discharge Summary  Patient ID: Patrick Carey MRN: [id number] DOB/AGE: 1933-04-10 85 y.o.  Admit date: 09/07/2018 Discharge date: 09/08/2018   Procedures:  Procedure(s) (LRB): TOTAL KNEE ARTHROPLASTY (Left)  Attending Physician:  Dr. Paralee Cancel   Admission Diagnoses:   Left knee primary OA / pain  Discharge Diagnoses:  Active Problems:   S/P left TKA   S/P total knee replacement, left  Past Medical History:  Diagnosis Date  . Asthma    allergy related  . Heartburn   . Hyperlipidemia   . Hypertension   . Prostate cancer ([place])   . Sleep apnea    cpap    HPI:     Patrick Carey, 85 y.o. male, has a history of pain and functional disability in the left knee due to arthritis and has failed non-surgical conservative treatments for greater than 12 weeks to includeNSAID's and/or analgesics, corticosteriod injections, viscosupplementation injections, use of assistive devices and activity modification.  Onset of symptoms was gradual, starting >10 years ago with gradually worsening course since that time. The patient noted no past surgery on the left knee(s).  Patient currently rates pain in the left knee(s) at 9 out of 10 with activity. Patient has worsening of pain with activity and weight bearing, pain that interferes with activities of daily living, pain with passive range of motion, crepitus and joint swelling.  Patient has evidence of periarticular osteophytes and joint space narrowing by imaging studies.  There is no active infection.   Risks, benefits and expectations were discussed with the patient.  Risks including but not limited to the risk of anesthesia, blood clots, nerve damage, blood vessel damage, failure of the prosthesis, infection and up to and including death.  Patient understand the risks, benefits and expectations and wishes to proceed with surgery.   PCP: Cyndi Bender, PA-C   Discharged Condition: good  Hospital Course:  Patient underwent the above  stated procedure on 09/07/2018. Patient tolerated the procedure well and brought to the recovery room in good condition and subsequently to the floor.  POD #1 BP: 168/79 ; Pulse: 66 ; Temp: 97.7 F (36.5 C) ; Resp: 16 Patient reports pain as mild, pain well controlled.  No reported events throughout the night.  Patient states that he feels that he worked well with therapy already.  We discussed the procedure, plan and expectations moving forward.  Patient will start therapy within 3 to 4 days.  Patient is ready be discharged home. Dorsiflexion/plantar flexion intact, incision: dressing C/D/I, no cellulitis present and compartment soft.   LABS  Basename    HGB     11.2  HCT     34.4    Discharge Exam: General appearance: alert, cooperative and no distress Extremities: Homans sign is negative, no sign of DVT, no edema, redness or tenderness in the calves or thighs and no ulcers, gangrene or trophic changes  Disposition:  Home with follow up in 2 weeks   Follow-up Information    Paralee Cancel, MD. Schedule an appointment as soon as possible for a visit in 2 weeks.   Specialty: Orthopedic Surgery Contact information: [address] [place] [address] [phone]           Discharge Instructions    Call MD / Call 911   Complete by: As directed    If you experience chest pain or shortness of breath, CALL 911 and be transported to the hospital emergency room.  If you develope a fever  above 101 F, pus (white drainage) or increased drainage or redness at the wound, or calf pain, call your surgeon's office.   Change dressing   Complete by: As directed    Maintain surgical dressing until follow up in the clinic. If the edges start to pull up, may reinforce with tape. If the dressing is no longer working, may remove and cover with gauze and tape, but must keep the area dry and clean.  Call with any questions or concerns.   Constipation Prevention   Complete by: As  directed    Drink plenty of fluids.  Prune juice may be helpful.  You may use a stool softener, such as Colace (over the counter) 100 mg twice a day.  Use MiraLax (over the counter) for constipation as needed.   Diet - low sodium heart healthy   Complete by: As directed    Discharge instructions   Complete by: As directed    Maintain surgical dressing until follow up in the clinic. If the edges start to pull up, may reinforce with tape. If the dressing is no longer working, may remove and cover with gauze and tape, but must keep the area dry and clean.  Follow up in 2 weeks at [facility]. Call with any questions or concerns.   Increase activity slowly as tolerated   Complete by: As directed    Weight bearing as tolerated with assist device (walker, cane, etc) as directed, use it as long as suggested by your surgeon or therapist, typically at least 4-6 weeks.   TED hose   Complete by: As directed    Use stockings (TED hose) for 2 weeks on both leg(s).  You may remove them at night for sleeping.      Allergies as of 09/08/2018      Reactions   Lipitor [atorvastatin] Other (See Comments)   Can tolerate in low dose   Prednisone Other (See Comments)   sleepless      Medication List    STOP taking these medications   aspirin 81 MG tablet Replaced by: aspirin 81 MG chewable tablet   meloxicam 15 MG tablet Commonly known as: MOBIC     TAKE these medications   amLODipine 2.5 MG tablet Commonly known as: NORVASC Take 2.5 mg by mouth daily.   aspirin 81 MG chewable tablet Commonly known as: Aspirin Childrens Chew 1 tablet (81 mg total) by mouth 2 (two) times daily. Take for 4 weeks, then resume regular dose. Replaces: aspirin 81 MG tablet   carbamazepine 200 MG tablet Commonly known as: TEGRETOL Take 200 mg by mouth 2 (two) times daily.   cloNIDine 0.2 MG tablet Commonly known as: CATAPRES Take 0.1 mg by mouth 2 (two) times daily.   docusate sodium 100 MG capsule  Commonly known as: Colace Take 1 capsule (100 mg total) by mouth 2 (two) times daily.   ferrous sulfate 325 (65 FE) MG tablet Commonly known as: FerrouSul Take 1 tablet (325 mg total) by mouth 3 (three) times daily with meals for 14 days.   HYDROcodone-acetaminophen 5-325 MG tablet Commonly known as: Norco Take 1-2 tablets by mouth every 4 (four) hours as needed for moderate pain or severe pain.   losartan 100 MG tablet Commonly known as: COZAAR Take 100 mg by mouth daily.   methocarbamol 500 MG tablet Commonly known as: Robaxin Take 1 tablet (500 mg total) by mouth every 6 (six) hours as needed for muscle spasms.   omeprazole 20 MG  capsule Commonly known as: PRILOSEC Take 20 mg by mouth daily.   polyethylene glycol 17 g packet Commonly known as: MIRALAX / GLYCOLAX Take 17 g by mouth 2 (two) times daily.   pravastatin 80 MG tablet Commonly known as: PRAVACHOL Take 80 mg by mouth every evening.   spironolactone 100 MG tablet Commonly known as: ALDACTONE Take 100 mg by mouth daily.            Discharge Care Instructions  (From admission, onward)         Start     Ordered   09/08/18 0000  Change dressing    Comments: Maintain surgical dressing until follow up in the clinic. If the edges start to pull up, may reinforce with tape. If the dressing is no longer working, may remove and cover with gauze and tape, but must keep the area dry and clean.  Call with any questions or concerns.   09/08/18 0844           Signed: West Pugh. Isolde Skaff   PA-C  09/17/2018, 10:35 AM

## 2018-12-29 DIAGNOSIS — J310 Chronic rhinitis: Secondary | ICD-10-CM | POA: Diagnosis not present

## 2018-12-29 DIAGNOSIS — Z20828 Contact with and (suspected) exposure to other viral communicable diseases: Secondary | ICD-10-CM | POA: Diagnosis not present

## 2019-01-05 DIAGNOSIS — Z23 Encounter for immunization: Secondary | ICD-10-CM | POA: Diagnosis not present

## 2019-02-10 DIAGNOSIS — I10 Essential (primary) hypertension: Secondary | ICD-10-CM | POA: Diagnosis not present

## 2019-02-10 DIAGNOSIS — G500 Trigeminal neuralgia: Secondary | ICD-10-CM | POA: Diagnosis not present

## 2019-02-10 DIAGNOSIS — Z6831 Body mass index (BMI) 31.0-31.9, adult: Secondary | ICD-10-CM | POA: Diagnosis not present

## 2019-02-10 DIAGNOSIS — E782 Mixed hyperlipidemia: Secondary | ICD-10-CM | POA: Diagnosis not present

## 2019-02-10 DIAGNOSIS — M1712 Unilateral primary osteoarthritis, left knee: Secondary | ICD-10-CM | POA: Diagnosis not present

## 2019-02-10 DIAGNOSIS — M109 Gout, unspecified: Secondary | ICD-10-CM | POA: Diagnosis not present

## 2019-02-10 DIAGNOSIS — G5603 Carpal tunnel syndrome, bilateral upper limbs: Secondary | ICD-10-CM | POA: Diagnosis not present

## 2019-02-28 DIAGNOSIS — Z0000 Encounter for general adult medical examination without abnormal findings: Secondary | ICD-10-CM | POA: Diagnosis not present

## 2019-02-28 DIAGNOSIS — Z9181 History of falling: Secondary | ICD-10-CM | POA: Diagnosis not present

## 2019-02-28 DIAGNOSIS — Z1331 Encounter for screening for depression: Secondary | ICD-10-CM | POA: Diagnosis not present

## 2019-02-28 DIAGNOSIS — Z6832 Body mass index (BMI) 32.0-32.9, adult: Secondary | ICD-10-CM | POA: Diagnosis not present

## 2019-02-28 DIAGNOSIS — E785 Hyperlipidemia, unspecified: Secondary | ICD-10-CM | POA: Diagnosis not present

## 2019-04-12 DIAGNOSIS — M109 Gout, unspecified: Secondary | ICD-10-CM | POA: Diagnosis not present

## 2019-04-12 DIAGNOSIS — H547 Unspecified visual loss: Secondary | ICD-10-CM | POA: Diagnosis not present

## 2019-04-12 DIAGNOSIS — G4733 Obstructive sleep apnea (adult) (pediatric): Secondary | ICD-10-CM | POA: Diagnosis not present

## 2019-04-12 DIAGNOSIS — E785 Hyperlipidemia, unspecified: Secondary | ICD-10-CM | POA: Diagnosis not present

## 2019-04-12 DIAGNOSIS — I10 Essential (primary) hypertension: Secondary | ICD-10-CM | POA: Diagnosis not present

## 2019-04-12 DIAGNOSIS — H9193 Unspecified hearing loss, bilateral: Secondary | ICD-10-CM | POA: Diagnosis not present

## 2019-04-12 DIAGNOSIS — K219 Gastro-esophageal reflux disease without esophagitis: Secondary | ICD-10-CM | POA: Diagnosis not present

## 2019-04-12 DIAGNOSIS — E669 Obesity, unspecified: Secondary | ICD-10-CM | POA: Diagnosis not present

## 2019-04-12 DIAGNOSIS — I2510 Atherosclerotic heart disease of native coronary artery without angina pectoris: Secondary | ICD-10-CM | POA: Diagnosis not present

## 2019-08-11 DIAGNOSIS — M109 Gout, unspecified: Secondary | ICD-10-CM | POA: Diagnosis not present

## 2019-08-11 DIAGNOSIS — I2510 Atherosclerotic heart disease of native coronary artery without angina pectoris: Secondary | ICD-10-CM | POA: Diagnosis not present

## 2019-08-11 DIAGNOSIS — E782 Mixed hyperlipidemia: Secondary | ICD-10-CM | POA: Diagnosis not present

## 2019-08-11 DIAGNOSIS — Z79899 Other long term (current) drug therapy: Secondary | ICD-10-CM | POA: Diagnosis not present

## 2019-08-11 DIAGNOSIS — Z6832 Body mass index (BMI) 32.0-32.9, adult: Secondary | ICD-10-CM | POA: Diagnosis not present

## 2019-08-11 DIAGNOSIS — M1712 Unilateral primary osteoarthritis, left knee: Secondary | ICD-10-CM | POA: Diagnosis not present

## 2019-08-11 DIAGNOSIS — G4733 Obstructive sleep apnea (adult) (pediatric): Secondary | ICD-10-CM | POA: Diagnosis not present

## 2019-08-11 DIAGNOSIS — I10 Essential (primary) hypertension: Secondary | ICD-10-CM | POA: Diagnosis not present

## 2019-08-11 DIAGNOSIS — G500 Trigeminal neuralgia: Secondary | ICD-10-CM | POA: Diagnosis not present

## 2019-11-01 DIAGNOSIS — R519 Headache, unspecified: Secondary | ICD-10-CM | POA: Diagnosis not present

## 2019-11-01 DIAGNOSIS — R42 Dizziness and giddiness: Secondary | ICD-10-CM | POA: Diagnosis not present

## 2019-11-03 DIAGNOSIS — R059 Cough, unspecified: Secondary | ICD-10-CM | POA: Diagnosis not present

## 2019-11-03 DIAGNOSIS — H8110 Benign paroxysmal vertigo, unspecified ear: Secondary | ICD-10-CM | POA: Diagnosis not present

## 2019-11-03 DIAGNOSIS — Z20822 Contact with and (suspected) exposure to covid-19: Secondary | ICD-10-CM | POA: Diagnosis not present

## 2019-11-06 ENCOUNTER — Telehealth (HOSPITAL_COMMUNITY): Payer: Self-pay

## 2019-11-06 ENCOUNTER — Other Ambulatory Visit: Payer: Self-pay | Admitting: Nurse Practitioner

## 2019-11-06 DIAGNOSIS — C61 Malignant neoplasm of prostate: Secondary | ICD-10-CM

## 2019-11-06 DIAGNOSIS — U071 COVID-19: Secondary | ICD-10-CM

## 2019-11-06 NOTE — Progress Notes (Signed)
I connected by phone with Patrick Carey on 11/06/2019 at 11:38 AM to discuss the potential use of a new treatment for mild to moderate COVID-19 viral infection in non-hospitalized patients.  This patient is a 86 y.o. male that meets the FDA criteria for Emergency Use Authorization of COVID monoclonal antibody casirivimab/imdevimab or bamlanivimab/eteseviamb.  Has a (+) direct SARS-CoV-2 viral test result  Has mild or moderate COVID-19   Is NOT hospitalized due to COVID-19  Is within 10 days of symptom onset  Has at least one of the high risk factor(s) for progression to severe COVID-19 and/or hospitalization as defined in EUA.  Specific high risk criteria : Older age (>/= 84 yo) and Cardiovascular disease or hypertension   I have spoken and communicated the following to the patient or parent/caregiver regarding COVID monoclonal antibody treatment:  1. FDA has authorized the emergency use for the treatment of mild to moderate COVID-19 in adults and pediatric patients with positive results of direct SARS-CoV-2 viral testing who are 59 years of age and older weighing at least 40 kg, and who are at high risk for progressing to severe COVID-19 and/or hospitalization.  2. The significant known and potential risks and benefits of COVID monoclonal antibody, and the extent to which such potential risks and benefits are unknown.  3. Information on available alternative treatments and the risks and benefits of those alternatives, including clinical trials.  4. Patients treated with COVID monoclonal antibody should continue to self-isolate and use infection control measures (e.g., wear mask, isolate, social distance, avoid sharing personal items, clean and disinfect "high touch" surfaces, and frequent handwashing) according to CDC guidelines.   5. The patient or parent/caregiver has the option to accept or refuse COVID monoclonal antibody treatment.  After reviewing this information with the  patient, the patient has agreed to receive one of the available covid 19 monoclonal antibodies and will be provided an appropriate fact sheet prior to infusion. Jobe Gibbon, NP 11/06/2019 11:38 AM

## 2019-11-06 NOTE — Telephone Encounter (Signed)
Returned call from hotline. Pt sx started 10/5 with positive test results today. Qualifies for treatment and interested. Await call from APP

## 2019-11-07 ENCOUNTER — Ambulatory Visit (HOSPITAL_COMMUNITY)
Admission: RE | Admit: 2019-11-07 | Discharge: 2019-11-07 | Disposition: A | Discharge status: Home / Self Care | Payer: Medicare Other | Source: Ambulatory Visit | Attending: Pulmonary Disease | Admitting: Pulmonary Disease

## 2019-11-07 DIAGNOSIS — Z23 Encounter for immunization: Secondary | ICD-10-CM | POA: Diagnosis not present

## 2019-11-07 DIAGNOSIS — C61 Malignant neoplasm of prostate: Secondary | ICD-10-CM | POA: Diagnosis present

## 2019-11-07 DIAGNOSIS — U071 COVID-19: Secondary | ICD-10-CM | POA: Diagnosis present

## 2019-11-07 MED ORDER — EPINEPHRINE 0.3 MG/0.3ML IJ SOAJ: 0.3000 mg | Freq: Once | INTRAMUSCULAR | Status: DC | PRN

## 2019-11-07 MED ORDER — DIPHENHYDRAMINE HCL 50 MG/ML IJ SOLN: 50.0000 mg | Freq: Once | INTRAMUSCULAR | Status: DC | PRN

## 2019-11-07 MED ORDER — ALBUTEROL SULFATE HFA 108 (90 BASE) MCG/ACT IN AERS: 2.0000 | INHALATION_SPRAY | Freq: Once | RESPIRATORY_TRACT | Status: DC | PRN

## 2019-11-07 MED ORDER — SODIUM CHLORIDE 0.9 % IV SOLN: Freq: Once | INTRAVENOUS | Status: AC

## 2019-11-07 MED ORDER — METHYLPREDNISOLONE SODIUM SUCC 125 MG IJ SOLR: 125.0000 mg | Freq: Once | INTRAMUSCULAR | Status: DC | PRN

## 2019-11-07 MED ORDER — FAMOTIDINE IN NACL 20-0.9 MG/50ML-% IV SOLN: 20.0000 mg | Freq: Once | INTRAVENOUS | Status: DC | PRN

## 2019-11-07 MED ORDER — SODIUM CHLORIDE 0.9 % IV SOLN: INTRAVENOUS | Status: DC | PRN

## 2019-11-07 NOTE — Discharge Instructions (Signed)

## 2019-11-07 NOTE — Progress Notes (Signed)
  Diagnosis: COVID-19  Physician: Dr. Asencion Noble  Procedure: [facility] Infusion [facility] Med: bamlanivimab\etesevimab infusion - Provided patient with bamlanimivab\etesevimab fact sheet for patients, parents and caregivers prior to infusion.  Complications: No immediate complications noted.  Discharge: Discharged home   Gaye Alken 11/07/2019

## 2019-12-14 DIAGNOSIS — M7062 Trochanteric bursitis, left hip: Secondary | ICD-10-CM | POA: Diagnosis not present

## 2019-12-14 DIAGNOSIS — M25552 Pain in left hip: Secondary | ICD-10-CM | POA: Diagnosis not present

## 2019-12-14 DIAGNOSIS — Z6833 Body mass index (BMI) 33.0-33.9, adult: Secondary | ICD-10-CM | POA: Diagnosis not present

## 2019-12-14 DIAGNOSIS — Z23 Encounter for immunization: Secondary | ICD-10-CM | POA: Diagnosis not present

## 2020-02-07 DIAGNOSIS — H6980 Other specified disorders of Eustachian tube, unspecified ear: Secondary | ICD-10-CM | POA: Diagnosis not present

## 2020-02-07 DIAGNOSIS — Z20822 Contact with and (suspected) exposure to covid-19: Secondary | ICD-10-CM | POA: Diagnosis not present

## 2020-03-01 DIAGNOSIS — Z6832 Body mass index (BMI) 32.0-32.9, adult: Secondary | ICD-10-CM | POA: Diagnosis not present

## 2020-03-01 DIAGNOSIS — E782 Mixed hyperlipidemia: Secondary | ICD-10-CM | POA: Diagnosis not present

## 2020-03-01 DIAGNOSIS — Z9181 History of falling: Secondary | ICD-10-CM | POA: Diagnosis not present

## 2020-03-01 DIAGNOSIS — E785 Hyperlipidemia, unspecified: Secondary | ICD-10-CM | POA: Diagnosis not present

## 2020-03-01 DIAGNOSIS — I10 Essential (primary) hypertension: Secondary | ICD-10-CM | POA: Diagnosis not present

## 2020-03-01 DIAGNOSIS — Z1331 Encounter for screening for depression: Secondary | ICD-10-CM | POA: Diagnosis not present

## 2020-03-01 DIAGNOSIS — Z0000 Encounter for general adult medical examination without abnormal findings: Secondary | ICD-10-CM | POA: Diagnosis not present

## 2020-03-01 DIAGNOSIS — M1712 Unilateral primary osteoarthritis, left knee: Secondary | ICD-10-CM | POA: Diagnosis not present

## 2020-03-01 DIAGNOSIS — I2510 Atherosclerotic heart disease of native coronary artery without angina pectoris: Secondary | ICD-10-CM | POA: Diagnosis not present

## 2020-03-12 DIAGNOSIS — E875 Hyperkalemia: Secondary | ICD-10-CM | POA: Diagnosis not present

## 2020-04-04 ENCOUNTER — Encounter: Payer: Self-pay | Admitting: Gastroenterology

## 2020-04-04 ENCOUNTER — Ambulatory Visit (INDEPENDENT_AMBULATORY_CARE_PROVIDER_SITE_OTHER): Payer: No Typology Code available for payment source | Admitting: Gastroenterology

## 2020-04-04 ENCOUNTER — Other Ambulatory Visit: Payer: Self-pay

## 2020-04-04 VITALS — BP 116/60 | HR 68 | Ht 67.0 in | Wt 224.5 lb

## 2020-04-04 DIAGNOSIS — K5900 Constipation, unspecified: Secondary | ICD-10-CM

## 2020-04-04 DIAGNOSIS — R159 Full incontinence of feces: Secondary | ICD-10-CM

## 2020-04-04 NOTE — Progress Notes (Signed)
I  HPI: This is a very pleasant 87 year old man who was referred to me by [facility] provider Raj Janus  Constipation, intermittent fecal incontinence  For the past 6 or 10 months at least he has been bothered with mild constipation, having to push and strain to move his bowels about 5-6 times per week as well as intermittent fecal incontinence when passing gas.  This occurs about twice per week.  He has not had any trauma to his backside.  His weight is overall stable.  He has no abdominal pains.  He never sees blood in his stool.  Colon cancer does not run in his family.  He believes he had a colonoscopy many many years ago   He usually has to strain to move his bowels and when that does come out they are soft, formed, brown, never bloody.   Old Data Reviewed: I reviewed a 30 page packet from the [place].  His referring provider was Raj Janus.  The reason for referral was "full incontinence of feces"  On 1 page there was a bit of detail "patient has incontinence of stool that is embarrassing, past history of constipation he has had bowel incontinence for years but did not mention it"  Also include is a medicine list  Also included is some blood work from June 8341 complete metabolic profile was normal, hemoglobin was normal at 13.6, platelets were normal at 189.    Review of systems: Pertinent positive and negative review of systems were noted in the above HPI section. All other review negative.   Past Medical History:  Diagnosis Date  . Asthma    allergy related  . Heartburn   . Hyperlipidemia   . Hypertension   . Prostate cancer ([place])   . Sleep apnea    cpap    Past Surgical History:  Procedure Laterality Date  . APPENDECTOMY  1967  . PROSTATE BIOPSY    . TOTAL KNEE ARTHROPLASTY Left 09/07/2018   Procedure: TOTAL KNEE ARTHROPLASTY;  Surgeon: Paralee Cancel, MD;  Location: [facility];  Service: Orthopedics;  Laterality: Left;  70 mins    Current Outpatient  Medications  Medication Sig Dispense Refill  . aspirin EC 81 MG tablet Take 81 mg by mouth daily. Swallow whole.    . chlorhexidine (HIBICLENS) 4 % external liquid Apply 1 application topically daily as needed.    Marland Kitchen losartan (COZAAR) 100 MG tablet Take 100 mg by mouth daily.    . meloxicam (MOBIC) 15 MG tablet Take 15 mg by mouth daily.    . Omega-3 Fatty Acids (FISH OIL) 1000 MG CAPS Take 1 capsule by mouth daily.    Marland Kitchen omeprazole (PRILOSEC) 20 MG capsule Take 20 mg by mouth daily.    . pravastatin (PRAVACHOL) 80 MG tablet Take 80 mg by mouth every evening.    Marland Kitchen spironolactone (ALDACTONE) 25 MG tablet Take 25 mg by mouth daily.    . vardenafil (LEVITRA) 20 MG tablet Take 20 mg by mouth daily as needed for erectile dysfunction.     No current facility-administered medications for this visit.    Allergies as of 04/04/2020 - Review Complete 04/04/2020  Allergen Reaction Noted  . Claritin-d 24 hour [loratadine-pseudoephedrine er]  04/04/2020  . Lipitor [atorvastatin] Other (See Comments) 03/11/2013  . Other  11/07/2019  . Prednisone Other (See Comments) 03/11/2013    Family History  Problem Relation Age of Onset  . Cancer Father        unknown  .  Cancer Brother        prostate    Social History   Socioeconomic History  . Marital status: Widowed    Spouse name: Not on file  . Number of children: Not on file  . Years of education: Not on file  . Highest education level: Not on file  Occupational History  . Not on file  Tobacco Use  . Smoking status: Never Smoker  . Smokeless tobacco: Never Used  Vaping Use  . Vaping Use: Never used  Substance and Sexual Activity  . Alcohol use: No  . Drug use: No  . Sexual activity: Not Currently  Other Topics Concern  . Not on file  Social History Narrative  . Not on file   Social Determinants of Health   Financial Resource Strain: Not on file  Food Insecurity: Not on file  Transportation Needs: Not on file  Physical Activity:  Not on file  Stress: Not on file  Social Connections: Not on file  Intimate Partner Violence: Not on file     Physical Exam: Ht 5\' 7"  (1.702 m) Comment: height measured without shoes  Wt 224 lb 8 oz (101.8 kg)   BMI 35.16 kg/m  Constitutional: generally well-appearing Psychiatric: alert and oriented x3 Eyes: extraocular movements intact Mouth: oral pharynx moist, no lesions Neck: supple no lymphadenopathy Cardiovascular: heart regular rate and rhythm Lungs: clear to auscultation bilaterally Abdomen: soft, nontender, nondistended, no obvious ascites, no peritoneal signs, normal bowel sounds Extremities: no lower extremity edema bilaterally Skin: no lesions on visible extremities   Assessment and plan: 87 y.o. male with constipation, intermittent fecal incontinence  He is 87, somewhat frail, walks with a cane.  I explained to him that I would like to try simple dietary measures first and if these are not helpful then he understands he will likely need further work-up including a possible colonoscopy.  To that end he will start fiber supplements Citrucel on a daily basis and return to see me in 2 months.  I see no reason for any further blood tests or imaging studies.   Please see the "Patient Instructions" section for addition details about the plan.   Owens Loffler, MD [facility] 04/04/2020, 9:41 AM  Cc: [facility]us   Total time on date of encounter was 45  minutes (this included time spent preparing to see the patient reviewing records; obtaining and/or reviewing separately obtained history; performing a medically appropriate exam and/or evaluation; counseling and educating the patient and family if present; ordering medications, tests or procedures if applicable; and documenting clinical information in the health record).

## 2020-04-04 NOTE — Patient Instructions (Addendum)
If you are age 85 or older, your body mass index should be between 23-30. Your Body mass index is 35.16 kg/m. If this is out of the aforementioned range listed, please consider follow up with your Primary Care Provider.  Please start taking citrucel (orange flavored) powder fiber supplement.  This may cause some bloating at first but that usually goes away. Begin with a small spoonful and work your way up to a large, heaping spoonful daily over a week.  You are schedule to follow up on 06-06-2020 at 10:50am.  Please arrive at 10:40am.  Thank you for entrusting me with your care and choosing [facility].  Dr Ardis Hughs

## 2020-06-06 ENCOUNTER — Encounter: Payer: Self-pay | Admitting: Gastroenterology

## 2020-06-06 ENCOUNTER — Ambulatory Visit (INDEPENDENT_AMBULATORY_CARE_PROVIDER_SITE_OTHER): Payer: No Typology Code available for payment source | Admitting: Gastroenterology

## 2020-06-06 VITALS — BP 138/66 | HR 70 | Ht 69.0 in | Wt 221.4 lb

## 2020-06-06 DIAGNOSIS — K5900 Constipation, unspecified: Secondary | ICD-10-CM

## 2020-06-06 DIAGNOSIS — R159 Full incontinence of feces: Secondary | ICD-10-CM | POA: Diagnosis not present

## 2020-06-06 NOTE — Patient Instructions (Addendum)
If you are age 85 or older, your body mass index should be between 23-30. Your Body mass index is 32.7 kg/m. If this is out of the aforementioned range listed, please consider follow up with your Primary Care Provider.  Please continue fiber supplements daily.  Please call our office with any future issues that you might have.  We will see you in follow up on an as needed basis.  Thank you for entrusting me with your care and choosing [facility].  Dr Ardis Hughs

## 2020-06-06 NOTE — Progress Notes (Signed)
Review of pertinent gastrointestinal problems: 1.  Chronic constipation.  Referred through the [facility] system, establish care March 2022 with Dr. Ardis Hughs.  Reason for referral "full incontinence of feces".  Blood work June 2021 normal CBC, normal hemoglobin.  Conservative therapy given age, frailty with dietary measures and fiber supplements.  Much improved on daily Citrucel capsules   HPI: This is a very pleasant 87 year old man whom I last saw about 2 months ago.  He tried the powder fiber Citrucel but found he preferred the capsule form instead and he has been taking 2 capsules of Citrucel fiber supplement once daily and has noticed a great improvement in his bowels.  He no longer has to push and strain.  He is not having incontinence.  He is quite happy with the way his bowels have responded.  He does not see any blood in his stool.  ROS: complete GI ROS as described in HPI, all other review negative.  Constitutional:  No unintentional weight loss   Past Medical History:  Diagnosis Date  . Asthma    allergy related  . GERD (gastroesophageal reflux disease)   . Heartburn   . Hyperlipidemia   . Hypertension   . Prostate cancer ([place])   . Sleep apnea    cpap    Past Surgical History:  Procedure Laterality Date  . APPENDECTOMY  1967  . CARPAL TUNNEL RELEASE Bilateral   . PROSTATE BIOPSY    . TOTAL KNEE ARTHROPLASTY Left 09/07/2018   Procedure: TOTAL KNEE ARTHROPLASTY;  Surgeon: Paralee Cancel, MD;  Location: [facility];  Service: Orthopedics;  Laterality: Left;  70 mins    Current Outpatient Medications  Medication Sig Dispense Refill  . amLODipine (NORVASC) 2.5 MG tablet Take 2.5 mg by mouth daily.    Marland Kitchen aspirin EC 81 MG tablet Take 81 mg by mouth daily. Swallow whole.    . carbamazepine (TEGRETOL) 200 MG tablet Take 400 mg by mouth daily.    . cholecalciferol (VITAMIN D) 25 MCG (1000 UNIT) tablet Take 1,000 Units by mouth daily.    . cloNIDine (CATAPRES) 0.2 MG tablet Take 0.2 mg by  mouth 2 (two) times daily.    Marland Kitchen losartan (COZAAR) 100 MG tablet Take 100 mg by mouth daily.    . meloxicam (MOBIC) 15 MG tablet Take 15 mg by mouth daily.    Marland Kitchen omeprazole (PRILOSEC) 20 MG capsule Take 20 mg by mouth daily.    . pravastatin (PRAVACHOL) 80 MG tablet Take 80 mg by mouth every evening.    Marland Kitchen spironolactone (ALDACTONE) 100 MG tablet Take 100 mg by mouth daily.    . vardenafil (LEVITRA) 20 MG tablet Take 20 mg by mouth daily as needed for erectile dysfunction.    . vitamin B-12 (CYANOCOBALAMIN) 500 MCG tablet Take 1,000 mcg by mouth daily.     No current facility-administered medications for this visit.    Allergies as of 06/06/2020 - Review Complete 04/04/2020  Allergen Reaction Noted  . Claritin-d 24 hour [loratadine-pseudoephedrine er]  04/04/2020  . Lipitor [atorvastatin] Other (See Comments) 03/11/2013  . Other  11/07/2019  . Prednisone Other (See Comments) 03/11/2013    Family History  Problem Relation Age of Onset  . Lung cancer Father   . Prostate cancer Brother   . CAD Mother   . Alzheimer's disease Sister   . Skin cancer Maternal Grandmother   . Anuerysm Brother        brain  . Hemachromatosis Daughter   . Multiple sclerosis  Brother     Social History   Socioeconomic History  . Marital status: Widowed    Spouse name: Not on file  . Number of children: 2  . Years of education: Not on file  . Highest education level: Not on file  Occupational History  . Occupation: retired  Tobacco Use  . Smoking status: Never Smoker  . Smokeless tobacco: Never Used  Vaping Use  . Vaping Use: Never used  Substance and Sexual Activity  . Alcohol use: Yes    Comment: home wine 2 per year  . Drug use: No  . Sexual activity: Not Currently  Other Topics Concern  . Not on file  Social History Narrative  . Not on file   Social Determinants of Health   Financial Resource Strain: Not on file  Food Insecurity: Not on file  Transportation Needs: Not on file   Physical Activity: Not on file  Stress: Not on file  Social Connections: Not on file  Intimate Partner Violence: Not on file     Physical Exam: BP 138/66   Pulse 70   Ht 5\' 9"  (1.753 m)   Wt 221 lb 6.4 oz (100.4 kg)   BMI 32.70 kg/m  Constitutional: generally well-appearing Psychiatric: alert and oriented x3 Abdomen: soft, nontender, nondistended, no obvious ascites, no peritoneal signs, normal bowel sounds No peripheral edema noted in lower extremities  Assessment and plan: 87 y.o. male with chronic constipation, intermittent incontinence  His symptoms have completely resolved since starting daily fiber supplements.  He is pretty happy about his results.  I explained that since he had such a good response with this conservative therapy I do not think he needs any further testing or treatments.  He will discontinue the fiber supplements indefinitely and he knows to call if he has any further questions or concerns.  Please see the "Patient Instructions" section for addition details about the plan.  Owens Loffler, MD [facility] 06/06/2020, 11:00 AM   Total time on date of encounter was 20 minutes (this included time spent preparing to see the patient reviewing records; obtaining and/or reviewing separately obtained history; performing a medically appropriate exam and/or evaluation; counseling and educating the patient and family if present; ordering medications, tests or procedures if applicable; and documenting clinical information in the health record).

## 2020-08-29 DIAGNOSIS — E782 Mixed hyperlipidemia: Secondary | ICD-10-CM | POA: Diagnosis not present

## 2020-08-29 DIAGNOSIS — Z139 Encounter for screening, unspecified: Secondary | ICD-10-CM | POA: Diagnosis not present

## 2020-08-29 DIAGNOSIS — I2510 Atherosclerotic heart disease of native coronary artery without angina pectoris: Secondary | ICD-10-CM | POA: Diagnosis not present

## 2020-08-29 DIAGNOSIS — M1712 Unilateral primary osteoarthritis, left knee: Secondary | ICD-10-CM | POA: Diagnosis not present

## 2020-08-29 DIAGNOSIS — Z6833 Body mass index (BMI) 33.0-33.9, adult: Secondary | ICD-10-CM | POA: Diagnosis not present

## 2020-08-29 DIAGNOSIS — I10 Essential (primary) hypertension: Secondary | ICD-10-CM | POA: Diagnosis not present

## 2020-08-29 DIAGNOSIS — G500 Trigeminal neuralgia: Secondary | ICD-10-CM | POA: Diagnosis not present

## 2020-08-29 DIAGNOSIS — G4733 Obstructive sleep apnea (adult) (pediatric): Secondary | ICD-10-CM | POA: Diagnosis not present

## 2020-09-12 DIAGNOSIS — E875 Hyperkalemia: Secondary | ICD-10-CM | POA: Diagnosis not present

## 2020-10-18 DIAGNOSIS — R6889 Other general symptoms and signs: Secondary | ICD-10-CM | POA: Diagnosis not present

## 2021-02-12 DIAGNOSIS — M7582 Other shoulder lesions, left shoulder: Secondary | ICD-10-CM | POA: Diagnosis not present

## 2021-03-04 DIAGNOSIS — Z1331 Encounter for screening for depression: Secondary | ICD-10-CM | POA: Diagnosis not present

## 2021-03-04 DIAGNOSIS — E785 Hyperlipidemia, unspecified: Secondary | ICD-10-CM | POA: Diagnosis not present

## 2021-03-04 DIAGNOSIS — Z0000 Encounter for general adult medical examination without abnormal findings: Secondary | ICD-10-CM | POA: Diagnosis not present

## 2021-03-04 DIAGNOSIS — Z6833 Body mass index (BMI) 33.0-33.9, adult: Secondary | ICD-10-CM | POA: Diagnosis not present

## 2021-03-04 DIAGNOSIS — Z9181 History of falling: Secondary | ICD-10-CM | POA: Diagnosis not present

## 2021-03-04 DIAGNOSIS — E669 Obesity, unspecified: Secondary | ICD-10-CM | POA: Diagnosis not present

## 2021-03-06 DIAGNOSIS — I2510 Atherosclerotic heart disease of native coronary artery without angina pectoris: Secondary | ICD-10-CM | POA: Diagnosis not present

## 2021-03-06 DIAGNOSIS — E782 Mixed hyperlipidemia: Secondary | ICD-10-CM | POA: Diagnosis not present

## 2021-03-06 DIAGNOSIS — Z6832 Body mass index (BMI) 32.0-32.9, adult: Secondary | ICD-10-CM | POA: Diagnosis not present

## 2021-03-06 DIAGNOSIS — M159 Polyosteoarthritis, unspecified: Secondary | ICD-10-CM | POA: Diagnosis not present

## 2021-03-06 DIAGNOSIS — I10 Essential (primary) hypertension: Secondary | ICD-10-CM | POA: Diagnosis not present

## 2021-03-06 DIAGNOSIS — G4733 Obstructive sleep apnea (adult) (pediatric): Secondary | ICD-10-CM | POA: Diagnosis not present

## 2021-03-06 DIAGNOSIS — G500 Trigeminal neuralgia: Secondary | ICD-10-CM | POA: Diagnosis not present

## 2021-03-06 DIAGNOSIS — K219 Gastro-esophageal reflux disease without esophagitis: Secondary | ICD-10-CM | POA: Diagnosis not present

## 2021-03-06 DIAGNOSIS — M109 Gout, unspecified: Secondary | ICD-10-CM | POA: Diagnosis not present

## 2021-03-06 DIAGNOSIS — R011 Cardiac murmur, unspecified: Secondary | ICD-10-CM | POA: Diagnosis not present

## 2021-03-18 DIAGNOSIS — I352 Nonrheumatic aortic (valve) stenosis with insufficiency: Secondary | ICD-10-CM | POA: Diagnosis not present

## 2021-03-18 DIAGNOSIS — I3481 Nonrheumatic mitral (valve) annulus calcification: Secondary | ICD-10-CM | POA: Diagnosis not present

## 2021-03-18 DIAGNOSIS — R011 Cardiac murmur, unspecified: Secondary | ICD-10-CM | POA: Diagnosis not present

## 2021-03-18 DIAGNOSIS — I350 Nonrheumatic aortic (valve) stenosis: Secondary | ICD-10-CM | POA: Diagnosis not present

## 2021-04-22 DIAGNOSIS — Z7982 Long term (current) use of aspirin: Secondary | ICD-10-CM | POA: Diagnosis not present

## 2021-04-22 DIAGNOSIS — G8929 Other chronic pain: Secondary | ICD-10-CM | POA: Diagnosis not present

## 2021-04-22 DIAGNOSIS — E669 Obesity, unspecified: Secondary | ICD-10-CM | POA: Diagnosis not present

## 2021-04-22 DIAGNOSIS — R269 Unspecified abnormalities of gait and mobility: Secondary | ICD-10-CM | POA: Diagnosis not present

## 2021-04-22 DIAGNOSIS — Z6831 Body mass index (BMI) 31.0-31.9, adult: Secondary | ICD-10-CM | POA: Diagnosis not present

## 2021-04-22 DIAGNOSIS — I10 Essential (primary) hypertension: Secondary | ICD-10-CM | POA: Diagnosis not present

## 2021-04-22 DIAGNOSIS — N529 Male erectile dysfunction, unspecified: Secondary | ICD-10-CM | POA: Diagnosis not present

## 2021-04-22 DIAGNOSIS — G43909 Migraine, unspecified, not intractable, without status migrainosus: Secondary | ICD-10-CM | POA: Diagnosis not present

## 2021-04-22 DIAGNOSIS — Z791 Long term (current) use of non-steroidal anti-inflammatories (NSAID): Secondary | ICD-10-CM | POA: Diagnosis not present

## 2021-04-22 DIAGNOSIS — K219 Gastro-esophageal reflux disease without esophagitis: Secondary | ICD-10-CM | POA: Diagnosis not present

## 2021-04-22 DIAGNOSIS — Z008 Encounter for other general examination: Secondary | ICD-10-CM | POA: Diagnosis not present

## 2021-04-22 DIAGNOSIS — H269 Unspecified cataract: Secondary | ICD-10-CM | POA: Diagnosis not present

## 2021-04-22 DIAGNOSIS — E785 Hyperlipidemia, unspecified: Secondary | ICD-10-CM | POA: Diagnosis not present

## 2021-08-02 DIAGNOSIS — I10 Essential (primary) hypertension: Secondary | ICD-10-CM | POA: Diagnosis not present

## 2021-08-02 DIAGNOSIS — R609 Edema, unspecified: Secondary | ICD-10-CM | POA: Diagnosis not present

## 2021-10-02 DIAGNOSIS — G4733 Obstructive sleep apnea (adult) (pediatric): Secondary | ICD-10-CM | POA: Diagnosis not present

## 2021-10-02 DIAGNOSIS — I2510 Atherosclerotic heart disease of native coronary artery without angina pectoris: Secondary | ICD-10-CM | POA: Diagnosis not present

## 2021-10-02 DIAGNOSIS — E782 Mixed hyperlipidemia: Secondary | ICD-10-CM | POA: Diagnosis not present

## 2021-10-02 DIAGNOSIS — I10 Essential (primary) hypertension: Secondary | ICD-10-CM | POA: Diagnosis not present

## 2021-10-02 DIAGNOSIS — M109 Gout, unspecified: Secondary | ICD-10-CM | POA: Diagnosis not present

## 2021-10-02 DIAGNOSIS — G500 Trigeminal neuralgia: Secondary | ICD-10-CM | POA: Diagnosis not present

## 2021-10-02 DIAGNOSIS — Z139 Encounter for screening, unspecified: Secondary | ICD-10-CM | POA: Diagnosis not present

## 2021-10-02 DIAGNOSIS — K219 Gastro-esophageal reflux disease without esophagitis: Secondary | ICD-10-CM | POA: Diagnosis not present

## 2021-10-02 DIAGNOSIS — M159 Polyosteoarthritis, unspecified: Secondary | ICD-10-CM | POA: Diagnosis not present

## 2021-10-15 DIAGNOSIS — U071 COVID-19: Secondary | ICD-10-CM | POA: Diagnosis not present

## 2022-05-28 DIAGNOSIS — Z791 Long term (current) use of non-steroidal anti-inflammatories (NSAID): Secondary | ICD-10-CM | POA: Diagnosis not present

## 2022-05-28 DIAGNOSIS — E669 Obesity, unspecified: Secondary | ICD-10-CM | POA: Diagnosis not present

## 2022-05-28 DIAGNOSIS — R011 Cardiac murmur, unspecified: Secondary | ICD-10-CM | POA: Diagnosis not present

## 2022-05-28 DIAGNOSIS — E785 Hyperlipidemia, unspecified: Secondary | ICD-10-CM | POA: Diagnosis not present

## 2022-05-28 DIAGNOSIS — G4733 Obstructive sleep apnea (adult) (pediatric): Secondary | ICD-10-CM | POA: Diagnosis not present

## 2022-05-28 DIAGNOSIS — I10 Essential (primary) hypertension: Secondary | ICD-10-CM | POA: Diagnosis not present

## 2022-05-28 DIAGNOSIS — Z6832 Body mass index (BMI) 32.0-32.9, adult: Secondary | ICD-10-CM | POA: Diagnosis not present

## 2022-05-28 DIAGNOSIS — Z8616 Personal history of COVID-19: Secondary | ICD-10-CM | POA: Diagnosis not present

## 2022-05-28 DIAGNOSIS — K219 Gastro-esophageal reflux disease without esophagitis: Secondary | ICD-10-CM | POA: Diagnosis not present

## 2022-05-28 DIAGNOSIS — G500 Trigeminal neuralgia: Secondary | ICD-10-CM | POA: Diagnosis not present

## 2022-05-28 DIAGNOSIS — M1990 Unspecified osteoarthritis, unspecified site: Secondary | ICD-10-CM | POA: Diagnosis not present

## 2022-05-28 DIAGNOSIS — R609 Edema, unspecified: Secondary | ICD-10-CM | POA: Diagnosis not present

## 2022-06-19 DIAGNOSIS — J069 Acute upper respiratory infection, unspecified: Secondary | ICD-10-CM | POA: Diagnosis not present

## 2022-06-19 DIAGNOSIS — R6889 Other general symptoms and signs: Secondary | ICD-10-CM | POA: Diagnosis not present

## 2022-08-29 DIAGNOSIS — I10 Essential (primary) hypertension: Secondary | ICD-10-CM | POA: Diagnosis not present

## 2022-08-29 DIAGNOSIS — E782 Mixed hyperlipidemia: Secondary | ICD-10-CM | POA: Diagnosis not present

## 2022-08-29 DIAGNOSIS — Z79899 Other long term (current) drug therapy: Secondary | ICD-10-CM | POA: Diagnosis not present

## 2022-09-01 DIAGNOSIS — Z9181 History of falling: Secondary | ICD-10-CM | POA: Diagnosis not present

## 2022-09-01 DIAGNOSIS — M159 Polyosteoarthritis, unspecified: Secondary | ICD-10-CM | POA: Diagnosis not present

## 2022-09-01 DIAGNOSIS — K219 Gastro-esophageal reflux disease without esophagitis: Secondary | ICD-10-CM | POA: Diagnosis not present

## 2022-09-01 DIAGNOSIS — Z1331 Encounter for screening for depression: Secondary | ICD-10-CM | POA: Diagnosis not present

## 2022-09-01 DIAGNOSIS — G500 Trigeminal neuralgia: Secondary | ICD-10-CM | POA: Diagnosis not present

## 2022-09-01 DIAGNOSIS — I10 Essential (primary) hypertension: Secondary | ICD-10-CM | POA: Diagnosis not present

## 2022-09-01 DIAGNOSIS — E782 Mixed hyperlipidemia: Secondary | ICD-10-CM | POA: Diagnosis not present

## 2022-09-01 DIAGNOSIS — R5383 Other fatigue: Secondary | ICD-10-CM | POA: Diagnosis not present

## 2022-09-01 DIAGNOSIS — G4733 Obstructive sleep apnea (adult) (pediatric): Secondary | ICD-10-CM | POA: Diagnosis not present

## 2022-09-01 DIAGNOSIS — I2510 Atherosclerotic heart disease of native coronary artery without angina pectoris: Secondary | ICD-10-CM | POA: Diagnosis not present

## 2022-10-01 DIAGNOSIS — U071 COVID-19: Secondary | ICD-10-CM | POA: Diagnosis not present

## 2022-11-07 DIAGNOSIS — Z23 Encounter for immunization: Secondary | ICD-10-CM | POA: Diagnosis not present

## 2022-11-07 DIAGNOSIS — Z139 Encounter for screening, unspecified: Secondary | ICD-10-CM | POA: Diagnosis not present

## 2022-11-07 DIAGNOSIS — I2510 Atherosclerotic heart disease of native coronary artery without angina pectoris: Secondary | ICD-10-CM | POA: Diagnosis not present

## 2022-11-07 DIAGNOSIS — I10 Essential (primary) hypertension: Secondary | ICD-10-CM | POA: Diagnosis not present

## 2022-11-07 DIAGNOSIS — R5383 Other fatigue: Secondary | ICD-10-CM | POA: Diagnosis not present

## 2022-11-20 DIAGNOSIS — Z9181 History of falling: Secondary | ICD-10-CM | POA: Diagnosis not present

## 2022-11-20 DIAGNOSIS — Z0000 Encounter for general adult medical examination without abnormal findings: Secondary | ICD-10-CM | POA: Diagnosis not present

## 2022-11-20 DIAGNOSIS — Z1331 Encounter for screening for depression: Secondary | ICD-10-CM | POA: Diagnosis not present

## 2022-11-20 DIAGNOSIS — Z139 Encounter for screening, unspecified: Secondary | ICD-10-CM | POA: Diagnosis not present

## 2023-02-02 DIAGNOSIS — Z008 Encounter for other general examination: Secondary | ICD-10-CM | POA: Diagnosis not present

## 2023-03-13 DIAGNOSIS — I10 Essential (primary) hypertension: Secondary | ICD-10-CM | POA: Diagnosis not present

## 2023-03-13 DIAGNOSIS — I2510 Atherosclerotic heart disease of native coronary artery without angina pectoris: Secondary | ICD-10-CM | POA: Diagnosis not present

## 2023-03-13 DIAGNOSIS — R7303 Prediabetes: Secondary | ICD-10-CM | POA: Diagnosis not present

## 2023-03-13 DIAGNOSIS — E782 Mixed hyperlipidemia: Secondary | ICD-10-CM | POA: Diagnosis not present

## 2023-03-13 DIAGNOSIS — J449 Chronic obstructive pulmonary disease, unspecified: Secondary | ICD-10-CM | POA: Diagnosis not present

## 2023-03-13 DIAGNOSIS — G4733 Obstructive sleep apnea (adult) (pediatric): Secondary | ICD-10-CM | POA: Diagnosis not present

## 2023-03-13 DIAGNOSIS — N183 Chronic kidney disease, stage 3 unspecified: Secondary | ICD-10-CM | POA: Diagnosis not present

## 2023-07-14 DIAGNOSIS — N183 Chronic kidney disease, stage 3 unspecified: Secondary | ICD-10-CM | POA: Diagnosis not present

## 2023-07-14 DIAGNOSIS — E782 Mixed hyperlipidemia: Secondary | ICD-10-CM | POA: Diagnosis not present

## 2023-07-14 DIAGNOSIS — J449 Chronic obstructive pulmonary disease, unspecified: Secondary | ICD-10-CM | POA: Diagnosis not present

## 2023-07-14 DIAGNOSIS — G4733 Obstructive sleep apnea (adult) (pediatric): Secondary | ICD-10-CM | POA: Diagnosis not present

## 2023-07-14 DIAGNOSIS — I2510 Atherosclerotic heart disease of native coronary artery without angina pectoris: Secondary | ICD-10-CM | POA: Diagnosis not present

## 2023-07-14 DIAGNOSIS — R7303 Prediabetes: Secondary | ICD-10-CM | POA: Diagnosis not present

## 2023-07-14 DIAGNOSIS — I10 Essential (primary) hypertension: Secondary | ICD-10-CM | POA: Diagnosis not present

## 2023-11-18 DIAGNOSIS — E559 Vitamin D deficiency, unspecified: Secondary | ICD-10-CM | POA: Diagnosis not present

## 2023-11-18 DIAGNOSIS — I2510 Atherosclerotic heart disease of native coronary artery without angina pectoris: Secondary | ICD-10-CM | POA: Diagnosis not present

## 2023-11-18 DIAGNOSIS — I10 Essential (primary) hypertension: Secondary | ICD-10-CM | POA: Diagnosis not present

## 2023-11-18 DIAGNOSIS — Z79899 Other long term (current) drug therapy: Secondary | ICD-10-CM | POA: Diagnosis not present

## 2023-11-18 DIAGNOSIS — J449 Chronic obstructive pulmonary disease, unspecified: Secondary | ICD-10-CM | POA: Diagnosis not present

## 2023-11-18 DIAGNOSIS — E782 Mixed hyperlipidemia: Secondary | ICD-10-CM | POA: Diagnosis not present

## 2023-11-18 DIAGNOSIS — G4733 Obstructive sleep apnea (adult) (pediatric): Secondary | ICD-10-CM | POA: Diagnosis not present

## 2023-11-18 DIAGNOSIS — R7303 Prediabetes: Secondary | ICD-10-CM | POA: Diagnosis not present

## 2023-11-18 DIAGNOSIS — I5030 Unspecified diastolic (congestive) heart failure: Secondary | ICD-10-CM | POA: Diagnosis not present

## 2023-11-18 DIAGNOSIS — G500 Trigeminal neuralgia: Secondary | ICD-10-CM | POA: Diagnosis not present

## 2023-11-18 DIAGNOSIS — N183 Chronic kidney disease, stage 3 unspecified: Secondary | ICD-10-CM | POA: Diagnosis not present
